# Patient Record
Sex: Male | Born: 1995 | Hispanic: Yes | Smoking: Current every day smoker
Health system: Southern US, Community
[De-identification: ages and names within clinical notes are randomized; demographics above are authoritative.]

---

## 2018-08-26 ENCOUNTER — Inpatient Hospital Stay
Admission: EM | Admit: 2018-08-26 | Discharge: 2018-09-01 | DRG: 193 | Disposition: A | Discharge status: Home / Self Care | Payer: Self-pay | Attending: Internal Medicine | Admitting: Internal Medicine

## 2018-08-26 ENCOUNTER — Emergency Department: Payer: Self-pay

## 2018-08-26 ENCOUNTER — Other Ambulatory Visit: Payer: Self-pay

## 2018-08-26 DIAGNOSIS — F17210 Nicotine dependence, cigarettes, uncomplicated: Secondary | ICD-10-CM | POA: Diagnosis present

## 2018-08-26 DIAGNOSIS — Z20828 Contact with and (suspected) exposure to other viral communicable diseases: Secondary | ICD-10-CM | POA: Diagnosis present

## 2018-08-26 DIAGNOSIS — E876 Hypokalemia: Secondary | ICD-10-CM | POA: Diagnosis present

## 2018-08-26 DIAGNOSIS — J189 Pneumonia, unspecified organism: Principal | ICD-10-CM

## 2018-08-26 DIAGNOSIS — J9601 Acute respiratory failure with hypoxia: Secondary | ICD-10-CM | POA: Diagnosis present

## 2018-08-26 DIAGNOSIS — R918 Other nonspecific abnormal finding of lung field: Secondary | ICD-10-CM

## 2018-08-26 DIAGNOSIS — J9690 Respiratory failure, unspecified, unspecified whether with hypoxia or hypercapnia: Secondary | ICD-10-CM

## 2018-08-26 DIAGNOSIS — J90 Pleural effusion, not elsewhere classified: Secondary | ICD-10-CM | POA: Diagnosis present

## 2018-08-26 DIAGNOSIS — R0902 Hypoxemia: Secondary | ICD-10-CM

## 2018-08-26 LAB — COMPREHENSIVE METABOLIC PANEL
ALT: 13 U/L (ref 0–44)
AST: 13 U/L — ABNORMAL LOW (ref 15–41)
Albumin: 3.7 g/dL (ref 3.5–5.0)
Alkaline Phosphatase: 51 U/L (ref 38–126)
Anion gap: 11 (ref 5–15)
BUN: 9 mg/dL (ref 6–20)
CO2: 23 mmol/L (ref 22–32)
Calcium: 8.4 mg/dL — ABNORMAL LOW (ref 8.9–10.3)
Chloride: 102 mmol/L (ref 98–111)
Creatinine, Ser: 0.95 mg/dL (ref 0.61–1.24)
GFR calc Af Amer: >60 mL/min (ref >60)
GFR calc non Af Amer: >60 mL/min (ref >60)
Glucose, Bld: 127 mg/dL — ABNORMAL HIGH (ref 70–99)
Potassium: 3.8 mmol/L (ref 3.5–5.1)
Sodium: 136 mmol/L (ref 135–145)
Total Bilirubin: 1.3 mg/dL — ABNORMAL HIGH (ref 0.3–1.2)
Total Protein: 7.1 g/dL (ref 6.5–8.1)

## 2018-08-26 LAB — CBC WITH DIFFERENTIAL/PLATELET
Abs Immature Granulocytes: 0.12 10*3/uL — ABNORMAL HIGH (ref 0.00–0.07)
Basophils Absolute: 0 10*3/uL (ref 0.0–0.1)
Basophils Relative: 0 %
Eosinophils Absolute: 0.2 10*3/uL (ref 0.0–0.5)
Eosinophils Relative: 1 %
HCT: 42.7 % (ref 39.0–52.0)
Hemoglobin: 14.9 g/dL (ref 13.0–17.0)
Immature Granulocytes: 1 %
Lymphocytes Relative: 5 %
Lymphs Abs: 0.8 10*3/uL (ref 0.7–4.0)
MCH: 30.9 pg (ref 26.0–34.0)
MCHC: 34.9 g/dL (ref 30.0–36.0)
MCV: 88.6 fL (ref 80.0–100.0)
Monocytes Absolute: 0.9 10*3/uL (ref 0.1–1.0)
Monocytes Relative: 5 %
Neutro Abs: 15.5 10*3/uL — ABNORMAL HIGH (ref 1.7–7.7)
Neutrophils Relative %: 88 %
Platelets: 215 10*3/uL (ref 150–400)
RBC: 4.82 MIL/uL (ref 4.22–5.81)
RDW: 12 % (ref 11.5–15.5)
WBC: 17.5 10*3/uL — ABNORMAL HIGH (ref 4.0–10.5)
nRBC: 0 % (ref 0.0–0.2)

## 2018-08-26 LAB — URINALYSIS, ROUTINE W REFLEX MICROSCOPIC
Bacteria, UA: NONE SEEN
Bilirubin Urine: NEGATIVE
Glucose, UA: NEGATIVE mg/dL
Ketones, ur: NEGATIVE mg/dL
Leukocytes,Ua: NEGATIVE
Nitrite: NEGATIVE
Protein, ur: NEGATIVE mg/dL
Specific Gravity, Urine: 1.004 — ABNORMAL LOW (ref 1.005–1.030)
Squamous Epithelial / HPF: NONE SEEN (ref 0–5)
WBC, UA: NONE SEEN WBC/hpf (ref 0–5)
pH: 8 (ref 5.0–8.0)

## 2018-08-26 LAB — INFLUENZA PANEL BY PCR (TYPE A & B)
Influenza A By PCR: NEGATIVE
Influenza B By PCR: NEGATIVE

## 2018-08-26 LAB — LACTIC ACID, PLASMA
Lactic Acid, Venous: 1.3 mmol/L (ref 0.5–1.9)
Lactic Acid, Venous: 0.8 mmol/L (ref 0.5–1.9)

## 2018-08-26 MED ORDER — SODIUM CHLORIDE 0.9 % IV BOLUS
1000.0000 mL | Freq: Once | INTRAVENOUS | Status: AC
  Administered 2018-08-26: 1000 mL via INTRAVENOUS

## 2018-08-26 MED ORDER — SODIUM CHLORIDE 0.9 % IV SOLN
2.0000 g | Freq: Once | INTRAVENOUS | Status: AC
  Administered 2018-08-26: 2 g via INTRAVENOUS
  Filled 2018-08-26: qty 2

## 2018-08-26 MED ORDER — VANCOMYCIN HCL IN DEXTROSE 1-5 GM/200ML-% IV SOLN
1000.0000 mg | Freq: Once | INTRAVENOUS | Status: AC
  Administered 2018-08-26: 1000 mg via INTRAVENOUS
  Filled 2018-08-26: qty 200

## 2018-08-26 MED ORDER — SODIUM CHLORIDE 0.9 % IV BOLUS
500.0000 mL | Freq: Once | INTRAVENOUS | Status: AC
  Administered 2018-08-26: 500 mL via INTRAVENOUS

## 2018-08-26 MED ORDER — ACETAMINOPHEN 500 MG PO TABS
1000.0000 mg | ORAL_TABLET | Freq: Once | ORAL | Status: AC
  Administered 2018-08-26: 1000 mg via ORAL
  Filled 2018-08-26: qty 2

## 2018-08-26 MED ORDER — SODIUM CHLORIDE 0.9 % IV SOLN
2.0000 g | Freq: Three times a day (TID) | INTRAVENOUS | Status: DC
  Administered 2018-08-26 – 2018-08-27 (×2): 2 g via INTRAVENOUS
  Filled 2018-08-26 (×4): qty 2

## 2018-08-26 MED ORDER — METRONIDAZOLE IN NACL 5-0.79 MG/ML-% IV SOLN
500.0000 mg | Freq: Once | INTRAVENOUS | Status: AC
  Administered 2018-08-26: 500 mg via INTRAVENOUS
  Filled 2018-08-26: qty 100

## 2018-08-26 MED ORDER — SODIUM CHLORIDE 0.9 % IV SOLN
INTRAVENOUS | Status: DC
  Administered 2018-08-26 – 2018-08-27 (×2): via INTRAVENOUS

## 2018-08-26 MED ORDER — ONDANSETRON HCL 4 MG PO TABS: 4.0000 mg | ORAL_TABLET | Freq: Four times a day (QID) | ORAL | Status: DC | PRN

## 2018-08-26 MED ORDER — HYDROXYCHLOROQUINE SULFATE 200 MG PO TABS
400.0000 mg | ORAL_TABLET | Freq: Two times a day (BID) | ORAL | Status: AC
  Administered 2018-08-26 – 2018-08-27 (×2): 400 mg via ORAL
  Filled 2018-08-26 (×2): qty 2

## 2018-08-26 MED ORDER — ONDANSETRON HCL 4 MG/2ML IJ SOLN: 4.0000 mg | Freq: Four times a day (QID) | INTRAMUSCULAR | Status: DC | PRN

## 2018-08-26 MED ORDER — ENOXAPARIN SODIUM 40 MG/0.4ML ~~LOC~~ SOLN: 40.0000 mg | SUBCUTANEOUS | Status: DC

## 2018-08-26 MED ORDER — HYDROXYCHLOROQUINE SULFATE 200 MG PO TABS
200.0000 mg | ORAL_TABLET | Freq: Two times a day (BID) | ORAL | Status: AC
  Administered 2018-08-27 – 2018-08-31 (×8): 200 mg via ORAL
  Filled 2018-08-26 (×8): qty 1

## 2018-08-26 MED ORDER — ACETAMINOPHEN 325 MG PO TABS
650.0000 mg | ORAL_TABLET | Freq: Four times a day (QID) | ORAL | Status: DC | PRN
  Administered 2018-08-27 – 2018-08-28 (×3): 650 mg via ORAL
  Filled 2018-08-26 (×3): qty 2

## 2018-08-26 MED ORDER — VANCOMYCIN HCL 10 G IV SOLR
1250.0000 mg | Freq: Two times a day (BID) | INTRAVENOUS | Status: DC
  Administered 2018-08-27: 1250 mg via INTRAVENOUS
  Filled 2018-08-26 (×3): qty 1250

## 2018-08-26 NOTE — H&P (Addendum)
[facility] - [place] at [facility]   PATIENT NAME: Anthony Pugh    MR#:  [id number]  DATE OF BIRTH:  10/29/1995  DATE OF ADMISSION:  08/26/2018  PRIMARY CARE PHYSICIAN: No primary care provider on file.   REQUESTING/REFERRING PHYSICIAN: Minna AntisPaduchowski Kevin MD  CHIEF COMPLAINT:   Chief Complaint  Patient presents with  . Fever  . Cough    HISTORY OF PRESENT ILLNESS: Anthony Pugh  is a 23 y.o. male with no significant medical history presents to the emergency room with shortness of breath cough and fever.  Patient since last night started coughing and becoming short of breath and fever that got progressively worse.  Patient went to the clinic and was found to be hypoxic with oxygen in the 80s.  Patient initially required 10 L nonrebreather but now is on less oxygen.  Continues to have dry cough in the emergency room.  PAST MEDICAL HISTORY: No medical history PAST SURGICAL HISTORY: none  SOCIAL HISTORY:  Social History   Tobacco Use  . Smoking status: Not on file  Substance Use Topics  . Alcohol use: Not on file    FAMILY HISTORY: History reviewed. No pertinent family history.  DRUG ALLERGIES: Not on File  REVIEW OF SYSTEMS:   CONSTITUTIONAL: No fever, fatigue or weakness.  EYES: No blurred or double vision.  EARS, NOSE, AND THROAT: No tinnitus or ear pain.  RESPIRATORY: Positive cough, positive shortness of breath, wheezing or hemoptysis.  CARDIOVASCULAR: No chest pain, orthopnea, edema.  GASTROINTESTINAL: No nausea, vomiting, diarrhea or abdominal pain.  GENITOURINARY: No dysuria, hematuria.  ENDOCRINE: No polyuria, nocturia,  HEMATOLOGY: No anemia, easy bruising or bleeding SKIN: No rash or lesion. MUSCULOSKELETAL: No joint pain or arthritis.   NEUROLOGIC: No tingling, numbness, weakness.  PSYCHIATRY: No anxiety or depression.   MEDICATIONS AT HOME:  Prior to Admission medications   Not on File      PHYSICAL  EXAMINATION:   VITAL SIGNS: Blood pressure (!) 163/83, pulse (!) 140, temperature (!) 100.4 F (38 C), resp. rate (!) 36, height 5\' 10"  (1.778 m), weight 68 kg, SpO2 95 %.  GENERAL:  23 y.o.-year-old patient lying in the bed mild respiratory distress EYES: Pupils equal, round, reactive to light and accommodation. No scleral icterus. Extraocular muscles intact.  HEENT: Head atraumatic, normocephalic. Oropharynx and nasopharynx clear.  NECK:  Supple, no jugular venous distention. No thyroid enlargement, no tenderness.  LUNGS: Bilateral rhonchus breath sounds  CARDIOVASCULAR: S1, S2 tachycardia l. No murmurs, rubs, or gallops.  ABDOMEN: Soft, nontender, nondistended. Bowel sounds present. No organomegaly or mass.  EXTREMITIES: No pedal edema, cyanosis, or clubbing.  NEUROLOGIC: Cranial nerves II through XII are intact. Muscle strength 5/5 in all extremities. Sensation intact. Gait not checked.  PSYCHIATRIC: The patient is alert and oriented x 3.  SKIN: No obvious rash, lesion, or ulcer.   LABORATORY PANEL:   CBC Recent Labs  Lab 08/26/18 1634  WBC 17.5*  HGB 14.9  HCT 42.7  PLT 215  MCV 88.6  MCH 30.9  MCHC 34.9  RDW 12.0  LYMPHSABS 0.8  MONOABS 0.9  EOSABS 0.2  BASOSABS 0.0   ------------------------------------------------------------------------------------------------------------------  Chemistries  Recent Labs  Lab 08/26/18 1634  NA 136  K 3.8  CL 102  CO2 23  GLUCOSE 127*  BUN 9  CREATININE 0.95  CALCIUM 8.4*  AST 13*  ALT 13  ALKPHOS 51  BILITOT 1.3*   ------------------------------------------------------------------------------------------------------------------ estimated creatinine clearance is 119.3 mL/min (  by C-G formula based on SCr of 0.95 mg/dL). ------------------------------------------------------------------------------------------------------------------ No results for input(s): TSH, T4TOTAL, T3FREE, THYROIDAB in the last 72  hours.  Invalid input(s): FREET3   Coagulation profile No results for input(s): INR, PROTIME in the last 168 hours. ------------------------------------------------------------------------------------------------------------------- No results for input(s): DDIMER in the last 72 hours. -------------------------------------------------------------------------------------------------------------------  Cardiac Enzymes No results for input(s): CKMB, TROPONINI, MYOGLOBIN in the last 168 hours.  Invalid input(s): CK ------------------------------------------------------------------------------------------------------------------ Invalid input(s): POCBNP  ---------------------------------------------------------------------------------------------------------------  Urinalysis    Component Value Date/Time   COLORURINE STRAW (A) 08/26/2018 1634   APPEARANCEUR CLEAR (A) 08/26/2018 1634   LABSPEC 1.004 (L) 08/26/2018 1634   PHURINE 8.0 08/26/2018 1634   GLUCOSEU NEGATIVE 08/26/2018 1634   HGBUR SMALL (A) 08/26/2018 1634   BILIRUBINUR NEGATIVE 08/26/2018 1634   KETONESUR NEGATIVE 08/26/2018 1634   PROTEINUR NEGATIVE 08/26/2018 1634   NITRITE NEGATIVE 08/26/2018 1634   LEUKOCYTESUR NEGATIVE 08/26/2018 1634     RADIOLOGY: Dg Chest Port 1 View  Result Date: 08/26/2018 CLINICAL DATA:  Shortness of breath with cough and fever EXAM: PORTABLE CHEST 1 VIEW COMPARISON:  None. FINDINGS: There is widespread airspace opacity throughout the mid and lower lung zones, somewhat more severe on the right than on the left. Heart size and pulmonary vascularity are normal. No adenopathy. No bone lesions. IMPRESSION: Extensive apparent multifocal pneumonia, more severe on the right than on the left. Atypical organism pneumonia is a differential consideration given this appearance. No adenopathy evident. Electronically Signed   By: Bretta Bang III M.D.   On: 08/26/2018 16:51    EKG: Orders placed or  performed during the hospital encounter of 08/26/18  . ED EKG 12-Lead  . ED EKG 12-Lead    IMPRESSION AND PLAN: Patient is a 23 year old with no past medical history presenting with acute respiratory failure  1.  Acute respiratory failure with multi lobar pneumonia suspect this is due to COVID-19 High risk We will treat patient with broad-spectrum antibiotics Check procalcitonin Infectious disease consult Aggressive IV fluid   2.  Miscellaneous Lovenox for DVT prophylaxis    All the records are reviewed and case discussed with ED provider. Management plans discussed with the patient, family and they are in agreement.  CODE STATUS: Full   TOTAL TIME TAKING CARE OF THIS PATIENT: 55 minutes.    Auburn Bilberry M.D on 08/26/2018 at 7:09 PM  Between 7am to 6pm - Pager - [phone]  After 6pm go to [email or web address] - password [email or web address]  [facility] Office  [phone]  CC: Primary care physician; No primary care provider on file.

## 2018-08-26 NOTE — ED Triage Notes (Signed)
Pt comes from home via EMS after pt has had fever and cough and SOB for one day. Pt went to urgent care and sats were in the 80s originally. Pt HR was 155 with EMS, oxygen sat was 96% on 10L nonrebreather. Interpreter being used for pt contact.

## 2018-08-26 NOTE — ED Provider Notes (Signed)
[facility] Emergency Department Provider Note  Time seen: 4:29 PM  I have reviewed the triage vital signs and the nursing notes.   HISTORY  Chief Complaint Fever and Cough    HPI Anthony Pugh is a 23 y.o. male with no significant past medical history who presents to the emergency department for shortness of breath, cough and fever.  According to the patient since last night he has been coughing with shortness of breath and fever that worsened today.  Patient went to the clinic today and was found to be hypoxic to 80% on room air.  No home O2 requirement at baseline, no chronic conditions such as asthma.  Patient denies any known sick contacts, denies any travel outside of the state.  Currently the patient appears well, alert oriented, no distress.  He is currently satting 98 to 100% on 10 L nonrebreather mask.  Patient is febrile to 101.  Continues to have a dry cough in the emergency department.   History reviewed. No pertinent past medical history.  There are no active problems to display for this patient.    Prior to Admission medications   Not on File    Not on File  History reviewed. No pertinent family history.  Social History Social History   Tobacco Use  . Smoking status: Not on file  Substance Use Topics  . Alcohol use: Not on file  . Drug use: Not on file    Review of Systems Constitutional: Positive for fever ENT: Negative for recent illness/congestion Cardiovascular: Mild chest tightness. Respiratory: Positive for shortness of breath. Gastrointestinal: Negative for abdominal pain.  One episode of vomiting this morning. Genitourinary: Negative for urinary compaints Musculoskeletal: Negative for musculoskeletal complaints Skin: Negative for skin complaints  Neurological: Negative for headache All other ROS negative  ____________________________________________   PHYSICAL EXAM:  VITAL SIGNS: ED Triage Vitals   Enc Vitals Group     BP 08/26/18 1624 139/84     Pulse Rate 08/26/18 1622 (!) 146     Resp 08/26/18 1626 (!) 32     Temp 08/26/18 1622 (!) 101 F (38.3 C)     Temp Source 08/26/18 1622 Oral     SpO2 08/26/18 1622 100 %     Weight 08/26/18 1624 150 lb (68 kg)     Height 08/26/18 1624 5\' 10"  (1.778 m)     Head Circumference --      Peak Flow --      Pain Score 08/26/18 1621 0     Pain Loc --      Pain Edu? --      Excl. in GC? --    Constitutional: Alert and oriented. Well appearing and in no distress. Eyes: Normal exam ENT   Head: Normocephalic and atraumatic.   Mouth/Throat: Mucous membranes are moist. Cardiovascular: Regular rhythm rate around 140 bpm.  No obvious murmur. Respiratory: Normal respiratory effort without tachypnea nor retractions. Breath sounds are clear.  Occasional dry cough. Gastrointestinal: Soft and nontender. No distention.   Musculoskeletal: Nontender with normal range of motion in all extremities. . Neurologic:  Normal speech and language. No gross focal neurologic deficits Skin:  Skin is warm, dry and intact.  Psychiatric: Mood and affect are normal.   ____________________________________________    EKG  EKG viewed and interpreted by myself shows a sinus tachycardia 149 bpm with a narrow QRS, normal axis, normal intervals, no concerning ST changes.  ____________________________________________    RADIOLOGY  Chest x-ray  consistent multifocal pneumonia.  ____________________________________________   INITIAL IMPRESSION / ASSESSMENT AND PLAN / ED COURSE  Pertinent labs & imaging results that were available during my care of the patient were reviewed by me and considered in my medical decision making (see chart for details).  Patient presents to the emergency department for fever cough hypoxia and tachycardia.  Patient meets sepsis criteria we will start broad-spectrum antibiotics, check labs including blood cultures.  Patient has  shortness of breath and chest discomfort.  Differential would include pneumonia, URI, viral illness such as influenza or corona.  We will check labs, cultures, check influenza as well as corona virus swabs.  We will start broad-spectrum antibiotics continue with significant IV hydration.  Patient agreeable to plan of care.  Chest x-ray consistent multifocal pneumonia.  Patient has a leukocytosis, febrile with tachycardia and tachypnea, hypoxia to 84% on room air in the emergency department.  Patient has been tested for coronavirus and flu which are both pending.  Patient has been started on broad-spectrum antibiotics will need admission to the hospital service for further treatment.    CRITICAL CARE Performed by: Minna AntisKevin Elzada Pytel   Total critical care time: 30 minutes  Critical care time was exclusive of separately billable procedures and treating other patients.  Critical care was necessary to treat or prevent imminent or life-threatening deterioration.  Critical care was time spent personally by me on the following activities: development of treatment plan with patient and/or surrogate as well as nursing, discussions with consultants, evaluation of patient's response to treatment, examination of patient, obtaining history from patient or surrogate, ordering and performing treatments and interventions, ordering and review of laboratory studies, ordering and review of radiographic studies, pulse oximetry and re-evaluation of patient's condition.   ____________________________________________   FINAL CLINICAL IMPRESSION(S) / ED DIAGNOSES  Sepsis Hypoxia Multifocal pneumonia   Minna AntisPaduchowski, Telissa Palmisano, MD 08/26/18 1729

## 2018-08-26 NOTE — Consult Note (Signed)
CODE SEPSIS - PHARMACY COMMUNICATION  **Broad Spectrum Antibiotics should be administered within 1 hour of Sepsis diagnosis**  Time Code Sepsis Called/Page Received: 1628  Antibiotics Ordered: Vancomycin/Cefepime/Metronidazole  Time of 1st antibiotic administration: 1655  Additional action taken by pharmacy: none  If necessary, Name of Provider/Nurse Contacted: none    Albina Billet, PharmD, BCPS Clinical Pharmacist 08/26/2018 4:51 PM

## 2018-08-26 NOTE — ED Notes (Signed)
ED TO INPATIENT HANDOFF REPORT  ED Nurse Name and Phone #: Lowanda Foster [phone]  S Name/Age/Gender Anthony Pugh 23 y.o. male Room/Bed: ED11A/ED11A  Code Status   Code Status: Not on file  Home/SNF/Other Home Patient oriented to: situation Is this baseline? Yes   Triage Complete: Triage complete  Chief Complaint Flu like Symptoms  Triage Note Pt comes from home via EMS after pt has had fever and cough and SOB for one day. Pt went to urgent care and sats were in the 80s originally. Pt HR was 155 with EMS, oxygen sat was 96% on 10L nonrebreather. Interpreter being used for pt contact.    Allergies Not on File  Level of Care/Admitting Diagnosis ED Disposition    ED Disposition Condition Comment   Admit  Hospital Area: [facility] [[id number]]  Level of Care: Telemetry [5]  Diagnosis: Multifocal pneumonia [4076808]  Admitting Physician: Auburn Bilberry [[id number]]  Attending Physician: Auburn Bilberry [[id number]]  Estimated length of stay: past midnight tomorrow  Certification:: I certify this patient will need inpatient services for at least 2 midnights  Bed request comments: COVID rule out high  PT Class (Do Not Modify): Inpatient [101]  PT Acc Code (Do Not Modify): Private [1]       B Medical/Surgery History History reviewed. No pertinent past medical history.    A IV Location/Drains/Wounds Patient Lines/Drains/Airways Status   Active Line/Drains/Airways    Name:   Placement date:   Placement time:   Site:   Days:   Peripheral IV 08/26/18 Left Antecubital   08/26/18    1646    Antecubital   less than 1   Peripheral IV 08/26/18 Right Antecubital   08/26/18    1647    Antecubital   less than 1          Intake/Output Last 24 hours No intake or output data in the 24 hours ending 08/26/18 1940  Labs/Imaging Results for orders placed or performed during the hospital encounter of 08/26/18 (from the past 48 hour(s))  Comprehensive metabolic  panel     Status: Abnormal   Collection Time: 08/26/18  4:34 PM  Result Value Ref Range   Sodium 136 135 - 145 mmol/L   Potassium 3.8 3.5 - 5.1 mmol/L   Chloride 102 98 - 111 mmol/L   CO2 23 22 - 32 mmol/L   Glucose, Bld 127 (H) 70 - 99 mg/dL   BUN 9 6 - 20 mg/dL   Creatinine, Ser 9.29 0.61 - 1.24 mg/dL   Calcium 8.4 (L) 8.9 - 10.3 mg/dL   Total Protein 7.1 6.5 - 8.1 g/dL   Albumin 3.7 3.5 - 5.0 g/dL   AST 13 (L) 15 - 41 U/L   ALT 13 0 - 44 U/L   Alkaline Phosphatase 51 38 - 126 U/L   Total Bilirubin 1.3 (H) 0.3 - 1.2 mg/dL   GFR calc non Af Amer >60 >60 mL/min   GFR calc Af Amer >60 >60 mL/min   Anion gap 11 5 - 15    Comment: Performed at [facility], [address] [place], [place] [address]  CBC WITH DIFFERENTIAL     Status: Abnormal   Collection Time: 08/26/18  4:34 PM  Result Value Ref Range   WBC 17.5 (H) 4.0 - 10.5 K/uL   RBC 4.82 4.22 - 5.81 MIL/uL   Hemoglobin 14.9 13.0 - 17.0 g/dL   HCT 86.3 81.7 - 71.1 %   MCV 88.6 80.0 -  100.0 fL   MCH 30.9 26.0 - 34.0 pg   MCHC 34.9 30.0 - 36.0 g/dL   RDW 40.912.0 81.111.5 - 91.415.5 %   Platelets 215 150 - 400 K/uL   nRBC 0.0 0.0 - 0.2 %   Neutrophils Relative % 88 %   Neutro Abs 15.5 (H) 1.7 - 7.7 K/uL   Lymphocytes Relative 5 %   Lymphs Abs 0.8 0.7 - 4.0 K/uL   Monocytes Relative 5 %   Monocytes Absolute 0.9 0.1 - 1.0 K/uL   Eosinophils Relative 1 %   Eosinophils Absolute 0.2 0.0 - 0.5 K/uL   Basophils Relative 0 %   Basophils Absolute 0.0 0.0 - 0.1 K/uL   Immature Granulocytes 1 %   Abs Immature Granulocytes 0.12 (H) 0.00 - 0.07 K/uL    Comment: Performed at [facility], [address] [place], [place] [address]  Urinalysis, Routine w reflex microscopic     Status: Abnormal   Collection Time: 08/26/18  4:34 PM  Result Value Ref Range   Color, Urine STRAW (A) YELLOW   APPearance CLEAR (A) CLEAR   Specific Gravity, Urine 1.004 (L) 1.005 - 1.030   pH 8.0 5.0 - 8.0   Glucose, UA NEGATIVE NEGATIVE mg/dL    Hgb urine dipstick SMALL (A) NEGATIVE   Bilirubin Urine NEGATIVE NEGATIVE   Ketones, ur NEGATIVE NEGATIVE mg/dL   Protein, ur NEGATIVE NEGATIVE mg/dL   Nitrite NEGATIVE NEGATIVE   Leukocytes,Ua NEGATIVE NEGATIVE   RBC / HPF 0-5 0 - 5 RBC/hpf   WBC, UA NONE SEEN 0 - 5 WBC/hpf   Bacteria, UA NONE SEEN NONE SEEN   Squamous Epithelial / LPF NONE SEEN 0 - 5    Comment: Performed at [facility], [address] [place], [place] [address]  Influenza panel by PCR (type A & B)     Status: None   Collection Time: 08/26/18  4:34 PM  Result Value Ref Range   Influenza A By PCR NEGATIVE NEGATIVE   Influenza B By PCR NEGATIVE NEGATIVE    Comment: (NOTE) The Xpert Xpress Flu assay is intended as an aid in the diagnosis of  influenza and should not be used as a sole basis for treatment.  This  assay is FDA approved for nasopharyngeal swab specimens only. Nasal  washings and aspirates are unacceptable for Xpert Xpress Flu testing. Performed at [facility], [address] [place], [place] [address]   Lactic acid, plasma     Status: None   Collection Time: 08/26/18  6:28 PM  Result Value Ref Range   Lactic Acid, Venous 1.3 0.5 - 1.9 mmol/L    Comment: Performed at [facility], [address] [place], [place] [address]   Dg Chest Port 1 View  Result Date: 08/26/2018 CLINICAL DATA:  Shortness of breath with cough and fever EXAM: PORTABLE CHEST 1 VIEW COMPARISON:  None. FINDINGS: There is widespread airspace opacity throughout the mid and lower lung zones, somewhat more severe on the right than on the left. Heart size and pulmonary vascularity are normal. No adenopathy. No bone lesions. IMPRESSION: Extensive apparent multifocal pneumonia, more severe on the right than on the left. Atypical organism pneumonia is a differential consideration given this appearance. No adenopathy evident. Electronically Signed   By: Bretta BangWilliam  Woodruff III M.D.   On: 08/26/2018 16:51     Pending Labs Unresulted Labs (From admission, onward)    Start     Ordered   08/26/18 1828  Lactic acid, plasma  Once,   STAT     08/26/18 1828   08/26/18 1629  Novel Coronavirus, NAA (hospital order; send-out to ref lab)  (Novel Coronavirus, NAA [facility] Order; send-out to ref lab) with precautions panel)  Once,   STAT    Question Answer Comment  Current symptoms Fever and Cough   Excluded other viral illnesses No (testing not indicated)   Patient immune status Normal      08/26/18 1629   08/26/18 1628  Blood Culture (routine x 2)  BLOOD CULTURE X 2,   STAT     08/26/18 1628   Signed and Held  Novel Coronavirus, NAA (hospital order; send-out to ref lab)  ([facility] IP COVID ADMIT NOVEL CORONAVIRUS, NAA (HOSPITAL ORDER; SEND-OUT TO REF LAB))  Once,   R    Question Answer Comment  Current symptoms Fever and Cough   Excluded other viral illnesses Yes   Exposure Risk Traveled to high risk areas in the last 14 days      Signed and Held   Signed and Held  CBC  (enoxaparin (LOVENOX)    CrCl >/= 30 ml/min)  Once,   R    Comments:  Baseline for enoxaparin therapy IF NOT ALREADY DRAWN.  Notify MD if PLT < 100 K.    Signed and Held   Signed and Held  Creatinine, serum  (enoxaparin (LOVENOX)    CrCl >/= 30 ml/min)  Once,   R    Comments:  Baseline for enoxaparin therapy IF NOT ALREADY DRAWN.    Signed and Held   Signed and Held  Creatinine, serum  (enoxaparin (LOVENOX)    CrCl >/= 30 ml/min)  Weekly,   R    Comments:  while on enoxaparin therapy    Signed and Held   Signed and Held  Respiratory Panel by PCR  Add-on,   R     Signed and Held   Signed and Held  Influenza panel by PCR (type A & B)  Add-on,   R     Signed and Held   Signed and Held  Culture, sputum-assessment  Once,   R     Signed and Held          Vitals/Pain Today's Vitals   08/26/18 1730 08/26/18 1800 08/26/18 1830 08/26/18 1900  BP: (!) 148/92 (!) 158/92 (!) 163/83   Pulse: (!) 145 (!) 147 (!) 140   Resp: (!) 40  (!) 47 (!) 36   Temp:    (!) 100.4 F (38 C)  TempSrc:      SpO2: 97% (!) 88% 95%   Weight:      Height:      PainSc:        Isolation Precautions Droplet and Contact precautions  Medications Medications  ceFEPIme (MAXIPIME) 2 g in sodium chloride 0.9 % 100 mL IVPB (2 g Intravenous Bolus 08/26/18 1655)  metroNIDAZOLE (FLAGYL) IVPB 500 mg (0 mg Intravenous Stopped 08/26/18 1805)  vancomycin (VANCOCIN) IVPB 1000 mg/200 mL premix (0 mg Intravenous Stopped 08/26/18 1852)  sodium chloride 0.9 % bolus 1,000 mL (1,000 mLs Intravenous Bolus 08/26/18 1744)  sodium chloride 0.9 % bolus 1,000 mL (1,000 mLs Intravenous Bolus 08/26/18 1652)  sodium chloride 0.9 % bolus 1,000 mL (1,000 mLs Intravenous Bolus 08/26/18 1650)  acetaminophen (TYLENOL) tablet 1,000 mg (1,000 mg Oral Given 08/26/18 1744)    Mobility walks Low fall risk   Focused Assessments Pulmonary Assessment Handoff:  Lung sounds: Bilateral Breath Sounds:  Diminished L Breath Sounds: Diminished R Breath Sounds: Diminished O2 Device: Nasal Cannula O2 Flow Rate (L/min): 3 L/min      R Recommendations: See Admitting Provider Note  Report given to:   Additional Notes:

## 2018-08-26 NOTE — Consult Note (Signed)
Pharmacy Antibiotic Note  Anthony Pugh is a 23 y.o. male admitted on 08/26/2018 with sepsis.  Pharmacy has been consulted for Vancomycin/Cefepime dosing.  Plan: Patient received Vancomycin 1000mg  IV x 1 ED, will follow with  Vancomycin 1250 mg IV Q 12 hrs. Goal AUC 400-550. Expected AUC: 493 SCr used: 0.95  Will dose Cefepime 2g IV q8hr   Height: 5\' 10"  (177.8 cm) Weight: 150 lb (68 kg) IBW/kg (Calculated) : 73  Temp (24hrs), Avg:100.7 F (38.2 C), Min:100.4 F (38 C), Max:101 F (38.3 C)  Recent Labs  Lab 08/26/18 1634 08/26/18 1828  WBC 17.5*  --   CREATININE 0.95  --   LATICACIDVEN  --  1.3    Estimated Creatinine Clearance: 119.3 mL/min (by C-G formula based on SCr of 0.95 mg/dL).    Not on File  Antimicrobials this admission: Vancomycin 4/8 >> Cefepime 4/8 >> Metronidazole 4/8 x 1  Dose adjustments this admission: None  Microbiology results: 4/8 BCx: pending 4/8 Novel Coronavirus pending  MRSA PCR?  Thank you for allowing pharmacy to be a part of this patient's care.  Albina Billet, PharmD, BCPS Clinical Pharmacist 08/26/2018 7:34 PM

## 2018-08-27 ENCOUNTER — Inpatient Hospital Stay: Payer: Self-pay

## 2018-08-27 ENCOUNTER — Other Ambulatory Visit: Payer: Self-pay

## 2018-08-27 DIAGNOSIS — R918 Other nonspecific abnormal finding of lung field: Secondary | ICD-10-CM

## 2018-08-27 DIAGNOSIS — R05 Cough: Secondary | ICD-10-CM

## 2018-08-27 DIAGNOSIS — R509 Fever, unspecified: Secondary | ICD-10-CM

## 2018-08-27 DIAGNOSIS — R0602 Shortness of breath: Secondary | ICD-10-CM

## 2018-08-27 DIAGNOSIS — R079 Chest pain, unspecified: Secondary | ICD-10-CM

## 2018-08-27 DIAGNOSIS — Z720 Tobacco use: Secondary | ICD-10-CM

## 2018-08-27 DIAGNOSIS — R000 Tachycardia, unspecified: Secondary | ICD-10-CM

## 2018-08-27 LAB — HEPATIC FUNCTION PANEL
ALT: 10 U/L (ref 0–44)
ALT: 10 U/L (ref 0–44)
AST: 13 U/L — ABNORMAL LOW (ref 15–41)
AST: 14 U/L — ABNORMAL LOW (ref 15–41)
Albumin: 3 g/dL — ABNORMAL LOW (ref 3.5–5.0)
Albumin: 3.1 g/dL — ABNORMAL LOW (ref 3.5–5.0)
Alkaline Phosphatase: 43 U/L (ref 38–126)
Alkaline Phosphatase: 40 U/L (ref 38–126)
Bilirubin, Direct: 0.2 mg/dL (ref 0.0–0.2)
Bilirubin, Direct: 0.1 mg/dL (ref 0.0–0.2)
Indirect Bilirubin: 0.9 mg/dL (ref 0.3–0.9)
Indirect Bilirubin: 0.6 mg/dL (ref 0.3–0.9)
Total Bilirubin: 1.1 mg/dL (ref 0.3–1.2)
Total Bilirubin: 0.7 mg/dL (ref 0.3–1.2)
Total Protein: 6.2 g/dL — ABNORMAL LOW (ref 6.5–8.1)
Total Protein: 6.3 g/dL — ABNORMAL LOW (ref 6.5–8.1)

## 2018-08-27 LAB — RESPIRATORY PANEL BY PCR

## 2018-08-27 LAB — BASIC METABOLIC PANEL
Anion gap: 10 (ref 5–15)
Anion gap: 9 (ref 5–15)
BUN: 7 mg/dL (ref 6–20)
BUN: 9 mg/dL (ref 6–20)
CO2: 20 mmol/L — ABNORMAL LOW (ref 22–32)
CO2: 23 mmol/L (ref 22–32)
Calcium: 7.9 mg/dL — ABNORMAL LOW (ref 8.9–10.3)
Calcium: 8.1 mg/dL — ABNORMAL LOW (ref 8.9–10.3)
Chloride: 106 mmol/L (ref 98–111)
Chloride: 105 mmol/L (ref 98–111)
Creatinine, Ser: 0.64 mg/dL (ref 0.61–1.24)
Creatinine, Ser: 0.79 mg/dL (ref 0.61–1.24)
GFR calc Af Amer: >60 mL/min (ref >60)
GFR calc Af Amer: >60 mL/min (ref >60)
GFR calc non Af Amer: >60 mL/min (ref >60)
GFR calc non Af Amer: >60 mL/min (ref >60)
Glucose, Bld: 118 mg/dL — ABNORMAL HIGH (ref 70–99)
Glucose, Bld: 118 mg/dL — ABNORMAL HIGH (ref 70–99)
Potassium: 3.7 mmol/L (ref 3.5–5.1)
Potassium: 3.7 mmol/L (ref 3.5–5.1)
Sodium: 136 mmol/L (ref 135–145)
Sodium: 137 mmol/L (ref 135–145)

## 2018-08-27 LAB — FERRITIN: Ferritin: 375 ng/mL — ABNORMAL HIGH (ref 24–336)

## 2018-08-27 LAB — C DIFFICILE QUICK SCREEN W PCR REFLEX
C Diff antigen: NEGATIVE
C Diff interpretation: NOT DETECTED

## 2018-08-27 LAB — C DIFFICILE QUICK SCREEN W PCR REFLEX??: C Diff toxin: NEGATIVE

## 2018-08-27 LAB — CBC
HCT: 40.1 % (ref 39.0–52.0)
Hemoglobin: 13.7 g/dL (ref 13.0–17.0)
MCH: 31.4 pg (ref 26.0–34.0)
MCHC: 34.2 g/dL (ref 30.0–36.0)
MCV: 91.8 fL (ref 80.0–100.0)
Platelets: 197 10*3/uL (ref 150–400)
RBC: 4.37 MIL/uL (ref 4.22–5.81)
RDW: 12.4 % (ref 11.5–15.5)
WBC: 14.2 10*3/uL — ABNORMAL HIGH (ref 4.0–10.5)
nRBC: 0 % (ref 0.0–0.2)

## 2018-08-27 LAB — PROCALCITONIN: Procalcitonin: 0.25 ng/mL

## 2018-08-27 LAB — C-REACTIVE PROTEIN: CRP: 23.9 mg/dL — ABNORMAL HIGH (ref <1.0)

## 2018-08-27 LAB — INFLUENZA PANEL BY PCR (TYPE A & B)
Influenza A By PCR: NEGATIVE
Influenza B By PCR: NEGATIVE

## 2018-08-27 LAB — GLUCOSE, CAPILLARY: Glucose-Capillary: 103 mg/dL — ABNORMAL HIGH (ref 70–99)

## 2018-08-27 LAB — LACTATE DEHYDROGENASE: LDH: 116 U/L (ref 98–192)

## 2018-08-27 LAB — MAGNESIUM: Magnesium: 2.1 mg/dL (ref 1.7–2.4)

## 2018-08-27 LAB — MRSA PCR SCREENING: MRSA by PCR: NEGATIVE

## 2018-08-27 MED ORDER — SODIUM CHLORIDE 0.9 % IV SOLN
2.0000 g | INTRAVENOUS | Status: DC
  Administered 2018-08-28: 2 g via INTRAVENOUS
  Filled 2018-08-27: qty 20
  Filled 2018-08-27: qty 2

## 2018-08-27 MED ORDER — DILTIAZEM LOAD VIA INFUSION: 10.0000 mg | Freq: Once | INTRAVENOUS | Status: DC

## 2018-08-27 MED ORDER — DILTIAZEM HCL 25 MG/5ML IV SOLN
10.0000 mg | Freq: Once | INTRAVENOUS | Status: AC
  Administered 2018-08-27: 10 mg via INTRAVENOUS
  Filled 2018-08-27: qty 5

## 2018-08-27 MED ORDER — COLCHICINE 0.6 MG PO TABS
0.6000 mg | ORAL_TABLET | Freq: Two times a day (BID) | ORAL | Status: DC
  Administered 2018-08-27 – 2018-08-29 (×5): 0.6 mg via ORAL
  Filled 2018-08-27 (×5): qty 1

## 2018-08-27 MED ORDER — DILTIAZEM HCL 25 MG/5ML IV SOLN
5.0000 mg | Freq: Once | INTRAVENOUS | Status: AC
  Administered 2018-08-27: 5 mg via INTRAVENOUS
  Filled 2018-08-27: qty 5

## 2018-08-27 MED ORDER — SODIUM CHLORIDE 0.9 % IV SOLN
500.0000 mg | INTRAVENOUS | Status: DC
  Administered 2018-08-28 – 2018-08-31 (×4): 500 mg via INTRAVENOUS
  Filled 2018-08-27 (×6): qty 500

## 2018-08-27 MED ORDER — DILTIAZEM HCL-DEXTROSE 100-5 MG/100ML-% IV SOLN (PREMIX): 5.0000 mg/h | INTRAVENOUS | Status: DC

## 2018-08-27 MED ORDER — SODIUM CHLORIDE 0.9 % IV SOLN
500.0000 mg | INTRAVENOUS | Status: DC
  Administered 2018-08-27: 500 mg via INTRAVENOUS
  Filled 2018-08-27: qty 500

## 2018-08-27 MED ORDER — SODIUM CHLORIDE 0.9 % IV BOLUS
500.0000 mL | Freq: Once | INTRAVENOUS | Status: AC
  Administered 2018-08-27: 500 mL via INTRAVENOUS

## 2018-08-27 MED ORDER — ZINC SULFATE 220 (50 ZN) MG PO CAPS
220.0000 mg | ORAL_CAPSULE | Freq: Every day | ORAL | Status: AC
  Administered 2018-08-27 – 2018-08-31 (×5): 220 mg via ORAL
  Filled 2018-08-27 (×5): qty 1

## 2018-08-27 MED ORDER — VITAMIN C 500 MG PO TABS
500.0000 mg | ORAL_TABLET | Freq: Every day | ORAL | Status: AC
  Administered 2018-08-27 – 2018-08-31 (×5): 500 mg via ORAL
  Filled 2018-08-27 (×5): qty 1

## 2018-08-27 MED ORDER — SODIUM CHLORIDE 0.9 % IV SOLN
2.0000 g | INTRAVENOUS | Status: DC
  Administered 2018-08-27: 2 g via INTRAVENOUS
  Filled 2018-08-27: qty 20
  Filled 2018-08-27: qty 2

## 2018-08-27 MED ORDER — ENOXAPARIN SODIUM 40 MG/0.4ML ~~LOC~~ SOLN
40.0000 mg | SUBCUTANEOUS | Status: DC
  Administered 2018-08-27 – 2018-08-30 (×4): 40 mg via SUBCUTANEOUS
  Filled 2018-08-27 (×5): qty 0.4

## 2018-08-27 MED ORDER — POTASSIUM CHLORIDE CRYS ER 20 MEQ PO TBCR
40.0000 meq | EXTENDED_RELEASE_TABLET | Freq: Once | ORAL | Status: AC
  Administered 2018-08-27: 40 meq via ORAL
  Filled 2018-08-27: qty 2

## 2018-08-27 MED ORDER — ACETAMINOPHEN 325 MG PO TABS
325.0000 mg | ORAL_TABLET | Freq: Once | ORAL | Status: AC
  Administered 2018-08-27: 325 mg via ORAL
  Filled 2018-08-27: qty 1

## 2018-08-27 NOTE — Progress Notes (Signed)
MD Mansy made aware of pt's HR, Fever, and I expressed concerns of MEWS of 6- 7 on shift. Verbal orders received for a Magnesium level, if Mag level is < 2, pt will need 2g of Magnesium IV. I will also give 325 mg more of Tylenol PO, 10 mg IV push of Cardizem, cooling blanket, and 40 Meq of Potasium.

## 2018-08-27 NOTE — Progress Notes (Signed)
Report given to Trula Ore, RN in ICU.  Pt transported via WC with oxygen and IV to ICU.

## 2018-08-27 NOTE — Progress Notes (Signed)
MD  Mansy made aware that pt's HR is up to the 170's when pt is up to the bathroom and it's still sustaining in the 120-140's, a verbal order given for 5 mg IV cardizem and strict I&O's. Will continue to monitor.

## 2018-08-27 NOTE — Progress Notes (Signed)
Pt's MEWS score increased to RED zone due to sinus tachycardia (125) and tachypnea (RR 40).  Pt denies feeling SOB or having difficulty breathing.  Resp effort is shallow and using abdominal muscles.  Diminished breath sounds throughout, and rhonchi to LLL.  Dr. Nemiah Commander contacted and received new orders to transfer to ICU.  Charge RN updated of situation.

## 2018-08-27 NOTE — Progress Notes (Signed)
[facility] - [place] [facility]   PATIENT NAME: Anthony FinlandJose Arrmando Ocampo Pugh    MR#:  [id number]  DATE OF BIRTH:  09/01/1995  SUBJECTIVE:  CHIEF COMPLAINT:   Chief Complaint  Patient presents with  . Fever  . Cough   - afebrile, feels some better - still on 4L o2 - loose stools today  REVIEW OF SYSTEMS:  Review of Systems  Constitutional: Negative for chills, fever and malaise/fatigue.  HENT: Negative for congestion, ear discharge, hearing loss and nosebleeds.   Eyes: Negative for blurred vision and double vision.  Respiratory: Positive for shortness of breath. Negative for cough and wheezing.   Cardiovascular: Positive for chest pain and palpitations. Negative for leg swelling.  Gastrointestinal: Positive for diarrhea. Negative for abdominal pain, constipation, nausea and vomiting.  Genitourinary: Negative for dysuria.  Musculoskeletal: Negative for myalgias.  Neurological: Negative for dizziness, focal weakness, seizures and headaches.  Psychiatric/Behavioral: Negative for depression.    DRUG ALLERGIES:  Not on File  VITALS:  Blood pressure 122/88, pulse (!) 117, temperature 98.8 F (37.1 C), temperature source Oral, resp. rate (!) 25, height 5\' 10"  (1.778 m), weight 68 kg, SpO2 100 %.  PHYSICAL EXAMINATION:  Physical Exam   GENERAL:  23 y.o.-year-old patient lying in the bed with no acute distress.  EYES: Pupils equal, round, reactive to light and accommodation. No scleral icterus. Extraocular muscles intact.  HEENT: Head atraumatic, normocephalic. Oropharynx and nasopharynx clear.  NECK:  Supple, no jugular venous distention. No thyroid enlargement, no tenderness.  LUNGS: moving air bilaterally, left basilar crackles and rhonchi, no wheezing,   or crepitation. No use of accessory muscles of respiration.  Diminished right basilar breath sounds CARDIOVASCULAR: S1, S2 rapid and regular. No murmurs, rubs, or gallops.  ABDOMEN: Soft, nontender,  nondistended. Bowel sounds present. No organomegaly or mass.  EXTREMITIES: No pedal edema, cyanosis, or clubbing.  NEUROLOGIC: Cranial nerves II through XII are intact. Muscle strength 5/5 in all extremities. Sensation intact. Gait not checked.  PSYCHIATRIC: The patient is alert and oriented x 3.  SKIN: No obvious rash, lesion, or ulcer.    LABORATORY PANEL:   CBC Recent Labs  Lab 08/26/18 1634  WBC 17.5*  HGB 14.9  HCT 42.7  PLT 215   ------------------------------------------------------------------------------------------------------------------  Chemistries  Recent Labs  Lab 08/26/18 1634 08/27/18 0629 08/27/18 0640  NA 136  --  136  K 3.8  --  3.7  CL 102  --  106  CO2 23  --  20*  GLUCOSE 127*  --  118*  BUN 9  --  7  CREATININE 0.95  --  0.64  CALCIUM 8.4*  --  7.9*  MG  --  2.1  --   AST 13*  --   --   ALT 13  --   --   ALKPHOS 51  --   --   BILITOT 1.3*  --   --    ------------------------------------------------------------------------------------------------------------------  Cardiac Enzymes No results for input(s): TROPONINI in the last 168 hours. ------------------------------------------------------------------------------------------------------------------  RADIOLOGY:  Dg Chest Port 1 View  Result Date: 08/26/2018 CLINICAL DATA:  Shortness of breath with cough and fever EXAM: PORTABLE CHEST 1 VIEW COMPARISON:  None. FINDINGS: There is widespread airspace opacity throughout the mid and lower lung zones, somewhat more severe on the right than on the left. Heart size and pulmonary vascularity are normal. No adenopathy. No bone lesions. IMPRESSION: Extensive apparent multifocal pneumonia, more severe on the right than on  the left. Atypical organism pneumonia is a differential consideration given this appearance. No adenopathy evident. Electronically Signed   By: Bretta Bang III M.D.   On: 08/26/2018 16:51    EKG:   Orders placed or performed  during the hospital encounter of 08/26/18  . ED EKG 12-Lead  . ED EKG 12-Lead    ASSESSMENT AND PLAN:   23 year old no significant past medical history presents to hospital secondary to chest pain, shortness of breath and fever.  1.  Acute hypoxic respiratory failure-multilobar pneumonia versus COVID-19 infection -On high risk precautions -COVID-19 test is pending. -Appreciate ID consult. -Patient currently on 4 L supplemental oxygen -On broad-spectrum antibiotics with vancomycin and cefepime.  Procalcitonin is slightly elevated only -On hydroxychloroquine -Check HIV testing.  Patient denies any IVDA or risky sexual behaviors.  2.  Leukocytosis-secondary to pneumonia  3.  DVT prophylaxis-Lovenox  4.  Hypokalemia-replaced   All the records are reviewed and case discussed with Care Management/Social Workerr. Management plans discussed with the patient, family and they are in agreement.  CODE STATUS: Full Code  TOTAL TIME TAKING CARE OF THIS PATIENT: 38 minutes.   POSSIBLE D/C IN 2-3 DAYS, DEPENDING ON CLINICAL CONDITION.   Enid Baas M.D on 08/27/2018 at 12:47 PM  Between 7am to 6pm - Pager - [phone]  After 6pm go to [email or web address] - password [email or web address]  [facility]  Office  [phone]  CC: Primary care physician; Patient, No Pcp Per

## 2018-08-27 NOTE — Consult Note (Addendum)
Reason for Consult: Assistance with management of acute hypoxic respiratory failure in the setting of potential COVID 19. Referring Physician: Enid Baas   Patient was interviewed in his native [place].  Anthony Pugh is an 23 y.o. male.  HPI:  The patient is a 24 year old [place] gentleman, who has been in this country for approximately a year.  He presented yesterday with complaints of generalized malaise and fever, he also describes a sensation of heaviness and tightness in his chest making it difficult to breathe.  He does not have any pleurodynia.  The symptoms started approximately 3 days prior to admission.  He has been working in [employer] and was able to go last to work on April 3.  He was not able to work Monday or Tuesday due to fatigue and then onset of symptoms.  Though he has never smoked, he states that he started this habit approximately 20 days ago and is smoking approximately 5 cigarettes/day.  He does not use a vape.  No known sick contacts in the home  He had not had any gastrointestinal symptoms until today when he had some loose stools but he attributes this to the IV antibiotics.   History reviewed. No pertinent past medical history.  History reviewed. No pertinent surgical history.  History reviewed. No pertinent family history.  Social History:  reports that he has never smoked. He has never used smokeless tobacco. He reports that he does not drink alcohol. No history on file for drug.  Recently picked up a smoking habit.  He lives with his father, brother, cousin and aunt.  Allergies: Not on File  Medications: I have reviewed the patient's current medications.  Results for orders placed or performed during the hospital encounter of 08/26/18 (from the past 48 hour(s))  Comprehensive metabolic panel     Status: Abnormal   Collection Time: 08/26/18  4:34 PM  Result Value Ref Range   Sodium 136 135 - 145 mmol/L   Potassium 3.8 3.5 - 5.1  mmol/L   Chloride 102 98 - 111 mmol/L   CO2 23 22 - 32 mmol/L   Glucose, Bld 127 (H) 70 - 99 mg/dL   BUN 9 6 - 20 mg/dL   Creatinine, Ser 4.09 0.61 - 1.24 mg/dL   Calcium 8.4 (L) 8.9 - 10.3 mg/dL   Total Protein 7.1 6.5 - 8.1 g/dL   Albumin 3.7 3.5 - 5.0 g/dL   AST 13 (L) 15 - 41 U/L   ALT 13 0 - 44 U/L   Alkaline Phosphatase 51 38 - 126 U/L   Total Bilirubin 1.3 (H) 0.3 - 1.2 mg/dL   GFR calc non Af Amer >60 >60 mL/min   GFR calc Af Amer >60 >60 mL/min   Anion gap 11 5 - 15    Comment: Performed at [facility], [address] [place], [place] [address]  CBC WITH DIFFERENTIAL     Status: Abnormal   Collection Time: 08/26/18  4:34 PM  Result Value Ref Range   WBC 17.5 (H) 4.0 - 10.5 K/uL   RBC 4.82 4.22 - 5.81 MIL/uL   Hemoglobin 14.9 13.0 - 17.0 g/dL   HCT 47.8 29.5 - 62.1 %   MCV 88.6 80.0 - 100.0 fL   MCH 30.9 26.0 - 34.0 pg   MCHC 34.9 30.0 - 36.0 g/dL   RDW 30.8 65.7 - 84.6 %   Platelets 215 150 - 400 K/uL   nRBC 0.0 0.0 - 0.2 %  Neutrophils Relative % 88 %   Neutro Abs 15.5 (H) 1.7 - 7.7 K/uL   Lymphocytes Relative 5 %   Lymphs Abs 0.8 0.7 - 4.0 K/uL   Monocytes Relative 5 %   Monocytes Absolute 0.9 0.1 - 1.0 K/uL   Eosinophils Relative 1 %   Eosinophils Absolute 0.2 0.0 - 0.5 K/uL   Basophils Relative 0 %   Basophils Absolute 0.0 0.0 - 0.1 K/uL   Immature Granulocytes 1 %   Abs Immature Granulocytes 0.12 (H) 0.00 - 0.07 K/uL    Comment: Performed at [facility], [address] [place], [place] [address]  Blood Culture (routine x 2)     Status: None (Preliminary result)   Collection Time: 08/26/18  4:34 PM  Result Value Ref Range   Specimen Description BLOOD RIGHT ANTECUBITAL    Special Requests      BOTTLES DRAWN AEROBIC AND ANAEROBIC Blood Culture adequate volume   Culture      NO GROWTH < 24 HOURS Performed at [facility], [address] [place], [place] [address]    Report Status PENDING   Blood Culture (routine x 2)      Status: None (Preliminary result)   Collection Time: 08/26/18  4:34 PM  Result Value Ref Range   Specimen Description BLOOD BLOOD RIGHT ARM    Special Requests      BOTTLES DRAWN AEROBIC AND ANAEROBIC Blood Culture results may not be optimal due to an excessive volume of blood received in culture bottles   Culture      NO GROWTH < 24 HOURS Performed at [facility], [address], [place], [place] [address]    Report Status PENDING   Urinalysis, Routine w reflex microscopic     Status: Abnormal   Collection Time: 08/26/18  4:34 PM  Result Value Ref Range   Color, Urine STRAW (A) YELLOW   APPearance CLEAR (A) CLEAR   Specific Gravity, Urine 1.004 (L) 1.005 - 1.030   pH 8.0 5.0 - 8.0   Glucose, UA NEGATIVE NEGATIVE mg/dL   Hgb urine dipstick SMALL (A) NEGATIVE   Bilirubin Urine NEGATIVE NEGATIVE   Ketones, ur NEGATIVE NEGATIVE mg/dL   Protein, ur NEGATIVE NEGATIVE mg/dL   Nitrite NEGATIVE NEGATIVE   Leukocytes,Ua NEGATIVE NEGATIVE   RBC / HPF 0-5 0 - 5 RBC/hpf   WBC, UA NONE SEEN 0 - 5 WBC/hpf   Bacteria, UA NONE SEEN NONE SEEN   Squamous Epithelial / LPF NONE SEEN 0 - 5    Comment: Performed at [facility], [address], [place], [place] [address]  Influenza panel by PCR (type A & B)     Status: None   Collection Time: 08/26/18  4:34 PM  Result Value Ref Range   Influenza A By PCR NEGATIVE NEGATIVE   Influenza B By PCR NEGATIVE NEGATIVE    Comment: (NOTE) The Xpert Xpress Flu assay is intended as an aid in the diagnosis of  influenza and should not be used as a sole basis for treatment.  This  assay is FDA approved for nasopharyngeal swab specimens only. Nasal  washings and aspirates are unacceptable for Xpert Xpress Flu testing. Performed at [facility], [address] [place], [place] [address]   Respiratory Panel by PCR     Status: None   Collection Time: 08/26/18  4:34 PM  Result Value Ref Range   Adenovirus NOT DETECTED NOT  DETECTED   Coronavirus 229E NOT DETECTED  NOT DETECTED    Comment: (NOTE) The Coronavirus on the Respiratory Panel, DOES NOT test for the novel  Coronavirus (2019 nCoV)    Coronavirus HKU1 NOT DETECTED NOT DETECTED   Coronavirus NL63 NOT DETECTED NOT DETECTED   Coronavirus OC43 NOT DETECTED NOT DETECTED   Metapneumovirus NOT DETECTED NOT DETECTED   Rhinovirus / Enterovirus NOT DETECTED NOT DETECTED   Influenza A NOT DETECTED NOT DETECTED   Influenza B NOT DETECTED NOT DETECTED   Parainfluenza Virus 1 NOT DETECTED NOT DETECTED   Parainfluenza Virus 2 NOT DETECTED NOT DETECTED   Parainfluenza Virus 3 NOT DETECTED NOT DETECTED   Parainfluenza Virus 4 NOT DETECTED NOT DETECTED   Respiratory Syncytial Virus NOT DETECTED NOT DETECTED   Bordetella pertussis NOT DETECTED NOT DETECTED   Chlamydophila pneumoniae NOT DETECTED NOT DETECTED   Mycoplasma pneumoniae NOT DETECTED NOT DETECTED    Comment: Performed at [facility], [address], [place], [place] [address]  Influenza panel by PCR (type A & B)     Status: None   Collection Time: 08/26/18  4:34 PM  Result Value Ref Range   Influenza A By PCR NEGATIVE NEGATIVE   Influenza B By PCR NEGATIVE NEGATIVE    Comment: (NOTE) The Xpert Xpress Flu assay is intended as an aid in the diagnosis of  influenza and should not be used as a sole basis for treatment.  This  assay is FDA approved for nasopharyngeal swab specimens only. Nasal  washings and aspirates are unacceptable for Xpert Xpress Flu testing. Performed at [facility], [address] [place], [place] [address]   Lactic acid, plasma     Status: None   Collection Time: 08/26/18  6:28 PM  Result Value Ref Range   Lactic Acid, Venous 1.3 0.5 - 1.9 mmol/L    Comment: Performed at [facility], [address] [place], [place] [address]  Lactic acid, plasma     Status: None   Collection Time: 08/26/18  8:38 PM  Result Value Ref Range   Lactic Acid, Venous 0.8  0.5 - 1.9 mmol/L    Comment: Performed at [facility], [address], [place], [place] [address]  Magnesium     Status: None   Collection Time: 08/27/18  6:29 AM  Result Value Ref Range   Magnesium 2.1 1.7 - 2.4 mg/dL    Comment: Performed at [facility], [address] [place], [place] [address]  Procalcitonin - Baseline     Status: None   Collection Time: 08/27/18  6:29 AM  Result Value Ref Range   Procalcitonin 0.25 ng/mL    Comment:        Interpretation: PCT (Procalcitonin) <= 0.5 ng/mL: Systemic infection (sepsis) is not likely. Local bacterial infection is possible. (NOTE)       Sepsis PCT Algorithm           Lower Respiratory Tract                                      Infection PCT Algorithm    ----------------------------     ----------------------------         PCT < 0.25 ng/mL                PCT < 0.10 ng/mL         Strongly encourage  Strongly discourage   discontinuation of antibiotics    initiation of antibiotics    ----------------------------     -----------------------------       PCT 0.25 - 0.50 ng/mL            PCT 0.10 - 0.25 ng/mL               OR       >80% decrease in PCT            Discourage initiation of                                            antibiotics      Encourage discontinuation           of antibiotics    ----------------------------     -----------------------------         PCT >= 0.50 ng/mL              PCT 0.26 - 0.50 ng/mL               AND        <80% decrease in PCT             Encourage initiation of                                             antibiotics       Encourage continuation           of antibiotics    ----------------------------     -----------------------------        PCT >= 0.50 ng/mL                  PCT > 0.50 ng/mL               AND         increase in PCT                  Strongly encourage                                      initiation of antibiotics    Strongly encourage  escalation           of antibiotics                                     -----------------------------                                           PCT <= 0.25 ng/mL                                                 OR                                        >  80% decrease in PCT                                     Discontinue / Do not initiate                                             antibiotics Performed at [facility], [address] [place], [place] [address]   Hepatic function panel     Status: Abnormal   Collection Time: 08/27/18  6:29 AM  Result Value Ref Range   Total Protein 6.2 (L) 6.5 - 8.1 g/dL   Albumin 3.0 (L) 3.5 - 5.0 g/dL   AST 13 (L) 15 - 41 U/L   ALT 10 0 - 44 U/L   Alkaline Phosphatase 43 38 - 126 U/L   Total Bilirubin 1.1 0.3 - 1.2 mg/dL   Bilirubin, Direct 0.2 0.0 - 0.2 mg/dL   Indirect Bilirubin 0.9 0.3 - 0.9 mg/dL    Comment: Performed at [facility], [address] [place], [place] [address]  Ferritin     Status: Abnormal   Collection Time: 08/27/18  6:29 AM  Result Value Ref Range   Ferritin 375 (H) 24 - 336 ng/mL    Comment: Performed at [facility], [address] [place], [place] [address]  C-reactive protein     Status: Abnormal   Collection Time: 08/27/18  6:29 AM  Result Value Ref Range   CRP 23.9 (H) <1.0 mg/dL    Comment: Performed at [facility], [address], [place], [place] [address]  Basic metabolic panel     Status: Abnormal   Collection Time: 08/27/18  6:40 AM  Result Value Ref Range   Sodium 136 135 - 145 mmol/L   Potassium 3.7 3.5 - 5.1 mmol/L   Chloride 106 98 - 111 mmol/L   CO2 20 (L) 22 - 32 mmol/L   Glucose, Bld 118 (H) 70 - 99 mg/dL   BUN 7 6 - 20 mg/dL   Creatinine, Ser 2.95 0.61 - 1.24 mg/dL   Calcium 7.9 (L) 8.9 - 10.3 mg/dL   GFR calc non Af Amer >60 >60 mL/min   GFR calc Af Amer >60 >60 mL/min   Anion gap 10 5 - 15    Comment: Performed at [facility], [address] [place], [place] [address]  C difficile quick scan w PCR reflex     Status: None   Collection Time: 08/27/18 12:08 PM  Result Value Ref Range   C Diff antigen NEGATIVE NEGATIVE   C Diff toxin NEGATIVE NEGATIVE   C Diff interpretation No C. difficile detected.     Comment: Performed at [facility], [address] [place], [place] [address]  CBC     Status: Abnormal   Collection Time: 08/27/18 12:12 PM  Result Value Ref Range   WBC 14.2 (H) 4.0 - 10.5 K/uL   RBC 4.37 4.22 - 5.81 MIL/uL   Hemoglobin 13.7 13.0 - 17.0 g/dL   HCT 46.9 62.9 - 52.8 %   MCV 91.8 80.0 - 100.0 fL   MCH 31.4 26.0 - 34.0 pg   MCHC 34.2 30.0 - 36.0 g/dL   RDW 41.3 24.4 - 01.0 %   Platelets 197 150 - 400  K/uL   nRBC 0.0 0.0 - 0.2 %    Comment: Performed at [facility], [address] [place], [place] [address]  MRSA PCR Screening     Status: None   Collection Time: 08/27/18  2:10 PM  Result Value Ref Range   MRSA by PCR NEGATIVE NEGATIVE    Comment:        The GeneXpert MRSA Assay (FDA approved for NASAL specimens only), is one component of a comprehensive MRSA colonization surveillance program. It is not intended to diagnose MRSA infection nor to guide or monitor treatment for MRSA infections. Performed at [facility], [address] [place], [place] [address]   Lactate dehydrogenase     Status: None   Collection Time: 08/27/18  3:38 PM  Result Value Ref Range   LDH 116 98 - 192 U/L    Comment: Performed at [facility], [address] [place], [place] [address]  Glucose, capillary     Status: Abnormal   Collection Time: 08/27/18  4:55 PM  Result Value Ref Range   Glucose-Capillary 103 (H) 70 - 99 mg/dL  Basic metabolic panel     Status: Abnormal   Collection Time: 08/27/18  4:56 PM  Result Value Ref Range   Sodium 137 135 - 145 mmol/L   Potassium 3.7 3.5 - 5.1 mmol/L   Chloride 105 98 - 111 mmol/L   CO2 23 22 - 32 mmol/L   Glucose, Bld 118  (H) 70 - 99 mg/dL   BUN 9 6 - 20 mg/dL   Creatinine, Ser 9.14 0.61 - 1.24 mg/dL   Calcium 8.1 (L) 8.9 - 10.3 mg/dL   GFR calc non Af Amer >60 >60 mL/min   GFR calc Af Amer >60 >60 mL/min   Anion gap 9 5 - 15    Comment: Performed at [facility], [address] [place], [place] [address]  Hepatic function panel     Status: Abnormal   Collection Time: 08/27/18  4:56 PM  Result Value Ref Range   Total Protein 6.3 (L) 6.5 - 8.1 g/dL   Albumin 3.1 (L) 3.5 - 5.0 g/dL   AST 14 (L) 15 - 41 U/L   ALT 10 0 - 44 U/L   Alkaline Phosphatase 40 38 - 126 U/L   Total Bilirubin 0.7 0.3 - 1.2 mg/dL   Bilirubin, Direct 0.1 0.0 - 0.2 mg/dL   Indirect Bilirubin 0.6 0.3 - 0.9 mg/dL    Comment: Performed at [facility], [address], [place], [place] [address]    Ct Chest Wo Contrast  Result Date: 08/27/2018 CLINICAL DATA:  Cough and chest pain. Pneumonia. Multifocal opacities on radiograph. EXAM: CT CHEST WITHOUT CONTRAST TECHNIQUE: Multidetector CT imaging of the chest was performed following the standard protocol without IV contrast. COMPARISON:  Radiograph earlier this day. FINDINGS: Cardiovascular: Normal caliber thoracic aorta. Heart is normal in size. No pericardial effusion. Mediastinum/Nodes: Mild multifocal mediastinal adenopathy. Right lower paratracheal node measures 11 mm short axis. Additional prominent mediastinal nodes. Lack of IV contrast limits assessment for hilar adenopathy. Triangular soft tissue density in the anterior mediastinum consistent with residual thymus, not unexpected for age. Lungs/Pleura: Multifocal ground-glass and consolidative opacities throughout all lobes of both lungs. Mild smooth septal thickening. There are small bilateral pleural effusions. Trachea and mainstem bronchi are patent. Upper Abdomen: No acute abnormality. Musculoskeletal: There are no acute or suspicious osseous abnormalities. IMPRESSION: Multifocal consolidative and ground-glass  opacities throughout all lobes of both  lungs. These findings are most consistent with pneumonia including atypical infection and viral pneumonia. However, the presence of bilateral pleural effusions and mediastinal adenopathy are atypical or uncommonly reported for COVID-19. Alternative diagnosis should be considered. Electronically Signed   By: Narda Rutherford M.D.   On: 08/27/2018 20:11   Dg Chest Port 1 View  Result Date: 08/26/2018 CLINICAL DATA:  Shortness of breath with cough and fever EXAM: PORTABLE CHEST 1 VIEW COMPARISON:  None. FINDINGS: There is widespread airspace opacity throughout the mid and lower lung zones, somewhat more severe on the right than on the left. Heart size and pulmonary vascularity are normal. No adenopathy. No bone lesions. IMPRESSION: Extensive apparent multifocal pneumonia, more severe on the right than on the left. Atypical organism pneumonia is a differential consideration given this appearance. No adenopathy evident. Electronically Signed   By: Bretta Bang III M.D.   On: 08/26/2018 16:51    Review of Systems  Constitutional: Positive for chills, fever and malaise/fatigue.  HENT: Positive for sore throat.   Eyes: Negative.   Respiratory: Positive for cough and shortness of breath. Negative for hemoptysis and sputum production.   Cardiovascular: Positive for chest pain.  Gastrointestinal: Negative.   Genitourinary: Negative.   Musculoskeletal: Positive for myalgias.  Skin: Negative.   Neurological: Negative.   Endo/Heme/Allergies: Negative.   Psychiatric/Behavioral: Negative.   All other systems reviewed and are negative.  Blood pressure 116/71, pulse 97, temperature 99 F (37.2 C), temperature source Oral, resp. rate (!) 28, height 5\' 10"  (1.778 m), weight 68 kg, SpO2 96 %. Physical Exam  Constitutional: He is oriented to person, place, and time. He appears well-developed and well-nourished. No distress.  HENT:  Head: Normocephalic and atraumatic.   Right Ear: External ear normal.  Left Ear: External ear normal.  Nose: Nose normal.  Mouth/Throat: Oropharynx is clear and moist. No oropharyngeal exudate.  Eyes: Pupils are equal, round, and reactive to light. Conjunctivae and EOM are normal. No scleral icterus.  Neck: Neck supple. No JVD present. No tracheal deviation present. No thyromegaly present.  Cardiovascular: Regular rhythm and normal pulses. Tachycardia present.  Patient was initially tachycardic and febrile, tachycardia resolved with control of fever.  Respiratory: No accessory muscle usage. Tachypnea noted. No respiratory distress.  Because of need for extensive PPE given COVID-19, auscultation of the lungs was not pursued.  POCUS the lungs was performed.  Both lungs were examined using ultrasound.  Finding was consistent with viral pneumonia the patient had pleural thickening bilaterally with irregular pleural contour.  Frequent B-lines noted at times confluent indicating consolidation.  Areas of consolidation appear to be scattered.  There were some subtle clear subpleural consolidations noted.  These findings are consistent with viral pneumonitis.  GI: Soft. Normal appearance. He exhibits no distension. There is no abdominal tenderness. There is no guarding.  Musculoskeletal: Normal range of motion.        General: No deformity or edema.  Lymphadenopathy:    He has no cervical adenopathy.  Neurological: He is alert and oriented to person, place, and time.  No overt focal deficits noted.  Skin: Skin is warm and dry.  Psychiatric: His behavior is normal.    Assessment/Plan:  1. Acute hypoxic respiratory failure in the setting of pneumonitis likely viral pneumonitis.  COVID-19 being considered.  Continue oxygen supplementation consider transitioning to high flow O2 early. Recommend prone positioning as patient tolerates 2 hours prone two hours supine as he is able to tolerate for approximately 16 hours.  Recommend incentive  spirometry when supine.  2.  Acute febrile illness with pneumonitis: COVID-19 being ruled out.  Use acetaminophen for control of fever.  In the setting of potential COVID-19 continue hydroxychloroquine.  Patient is on a azithromycin and ceftriaxone for potential superimposed bacterial infection.  Monitor for potential cytokine storm.  Initiate colchicine 0.6 mg twice a day as per [place] trial on COVID-19.  3.  Tachycardia due to fever: Control  fever.  Use acetaminophen, avoid nonsteroidals.   I updated the patient's father via phone.  Gailen Shelter. Laura , MD [facility]  08/27/2018, 10:39 PM

## 2018-08-27 NOTE — Consult Note (Addendum)
NAME: Anthony Pugh  DOB: [birth year]  MRN: [id number]  Date/Time: 08/27/2018 2:01 PM  REQUESTING PROVIDER: Allena Katz Subjective:  REASON FOR CONSULT: covid ?spoke to patient in spanish  with help  google translate   Anthony Pugh Jerrilyn Cairo is a 23 y.o. male from [place] in the [place] for the past year with no medical history presents with cough , chest pain and fever of 3 days. 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 This Tuesday he developed a cough and also started having pain in his chest , it got worse and he became sob and also started with fever. As he was not getting better he went to urgent care and pulse ox was in the 80s and he was sent to the ED. He lives with his dad, brother, aunt and cousin. No one is sick at home- He does not know of anyone sick at his work- He did not travel he says Pt thinks that he got sick because he started smoking 20 days ago- smokes 5 /day-does not vape   PMH None  PSH None  SH -lives with his dad and brother and other family members Smoking since 20 days  drinks beer  FH- says his dad does not have any issues ? Current Facility-Administered Medications  Medication Dose Route Frequency Provider Last Rate Last Dose  . 0.9 %  sodium chloride infusion   Intravenous Continuous Mayo, Allyn Kenner, MD 100 mL/hr at 08/27/18 1200    . acetaminophen (TYLENOL) tablet 650 mg  650 mg Oral Q6H PRN Auburn Bilberry, MD   650 mg at 08/27/18 0544  . ceFEPIme (MAXIPIME) 2 g in sodium chloride 0.9 % 100 mL IVPB  2 g Intravenous Q8H Albina Billet, RPH   Stopped at 08/27/18 1026  . enoxaparin (LOVENOX) injection 40 mg  40 mg Subcutaneous Q24H Bertram Savin, RPH      . hydroxychloroquine (PLAQUENIL) tablet 200 mg  200 mg Oral BID Auburn Bilberry, MD      . ondansetron [facility]) tablet 4 mg  4 mg Oral Q6H PRN Auburn Bilberry, MD       Or  . ondansetron [facility]) injection 4 mg  4 mg Intravenous Q6H PRN Auburn Bilberry, MD      . vancomycin (VANCOCIN) 1,250 mg in sodium chloride 0.9 % 250 mL IVPB  1,250 mg Intravenous Q12H Albina Billet, RPH 166.7 mL/hr at 08/27/18 1129 1,250 mg at 08/27/18 1129     Abtx:  Anti-infectives (From admission, onward)   Start     Dose/Rate Route Frequency Ordered Stop   08/27/18 2000  hydroxychloroquine (PLAQUENIL) tablet 200 mg     200 mg Oral 2 times daily 08/26/18 2034 08/31/18 1959   08/27/18 0800  vancomycin (VANCOCIN) 1,250 mg in sodium chloride 0.9 % 250 mL IVPB     1,250 mg 166.7 mL/hr over 90 Minutes Intravenous Every 12 hours 08/26/18 1942     08/27/18 0000  ceFEPIme (MAXIPIME) 2 g in sodium chloride 0.9 % 100 mL IVPB     2 g 200 mL/hr over 30 Minutes Intravenous Every 8 hours 08/26/18 1942     08/26/18 2045  hydroxychloroquine (PLAQUENIL) tablet 400 mg     400 mg Oral 2 times daily 08/26/18 2034 08/27/18 0946   08/26/18 1630  ceFEPIme (MAXIPIME) 2 g in sodium chloride 0.9 % 100 mL IVPB     2 g 200 mL/hr over 30 Minutes Intravenous  Once 08/26/18 1628 08/26/18 1725   08/26/18 1630  metroNIDAZOLE (FLAGYL) IVPB 500 mg     500 mg 100 mL/hr over 60 Minutes Intravenous  Once 08/26/18 1628 08/26/18 1805   08/26/18 1630  vancomycin (VANCOCIN) IVPB 1000 mg/200 mL premix     1,000 mg 200 mL/hr over 60 Minutes Intravenous  Once 08/26/18 1628 08/26/18 1852      REVIEW OF SYSTEMS:  Const:  fever,  chills, negative weight loss Eyes: negative diplopia or visual changes, negative eye pain ENT: negative coryza, negative sore throat Resp: ++ cough dry , no hemoptysis, ++dyspnea Cards: + chest pain at the center , palpitations, no lower extremity edema GU: negative for frequency, dysuria and hematuria GI: Negative for abdominal pain, diarrhea, bleeding, constipation Skin: negative for rash and pruritus Heme: negative for easy bruising and gum/nose bleeding MS: ++ myalgias, no arthralgias, back pain and muscle weakness Neurolo:negative for headaches,  dizziness, vertigo, memory problems  Psych: negative for feelings of anxiety, depression  Endocrine: negative for thyroid, diabetes issues Allergy/Immunology- negative for any medication or food allergies ? Objective:  VITALS:  BP 122/88   Pulse (!) 117   Temp 98.8 F (37.1 C) (Oral)   Resp (!) 25   Ht  (1.778 m)   Wt 68 kg   SpO2 100%   BMI 21.52 kg/m  PHYSICAL EXAM:  General: Alert, cooperative, coughs when he talks, on nasal oxygen Head: Normocephalic, without obvious abnormality, atraumatic. Eyes: Conjunctivae clear, anicteric sclerae. Pupils are equal ENT Nares normal. No drainage or sinus tenderness. Lips, mucosa, and tongue normal. No Thrush Neck: Supple, symmetrical, no adenopathy, thyroid: non tender no carotid bruit and no JVD. Lungs: b/l air entry- crepts Heart: tachycardia Abdomen: Soft, non-tender,not distended. Bowel sounds normal. No masses Extremities: atraumatic, no cyanosis. No edema. No clubbing Skin: No rashes or lesions. Or bruising Lymph: Cervical, supraclavicular normal. Neurologic: Grossly non-focal Pertinent Labs Lab Results CBC    Component Value Date/Time   WBC 14.2 (H) 08/27/2018 1212   RBC 4.37 08/27/2018 1212   HGB 13.7 08/27/2018 1212   HCT 40.1 08/27/2018 1212   PLT 197 08/27/2018 1212   MCV 91.8 08/27/2018 1212   MCH 31.4 08/27/2018 1212   MCHC 34.2 08/27/2018 1212   RDW 12.4 08/27/2018 1212   LYMPHSABS 0.8 08/26/2018 1634   MONOABS 0.9 08/26/2018 1634   EOSABS 0.2 08/26/2018 1634   BASOSABS 0.0 08/26/2018 1634    CMP Latest Ref Rng & Units 08/27/2018 08/26/2018  Glucose 70 - 99 mg/dL 914(N) 829(F)  BUN 6 - 20 mg/dL 7 9  Creatinine 6.21 - 1.24 mg/dL 3.08 6.57  Sodium 846 - 145 mmol/L 136 136  Potassium 3.5 - 5.1 mmol/L 3.7 3.8  Chloride 98 - 111 mmol/L 106 102  CO2 22 - 32 mmol/L 20(L) 23  Calcium 8.9 - 10.3 mg/dL 7.9(L) 8.4(L)  Total Protein 6.5 - 8.1 g/dL 6.2(L) 7.1  Total Bilirubin 0.3 - 1.2 mg/dL 1.1 9.6(E)   Alkaline Phos 38 - 126 U/L 43 51  AST 15 - 41 U/L 13(L) 13(L)  ALT 0 - 44 U/L 10 13      Microbiology: Recent Results (from the past 240 hour(s))  Blood Culture (routine x 2)     Status: None (Preliminary result)   Collection Time: 08/26/18  4:34 PM  Result Value Ref Range Status   Specimen Description BLOOD RIGHT ANTECUBITAL  Final   Special Requests   Final    BOTTLES DRAWN AEROBIC AND  ANAEROBIC Blood Culture adequate volume   Culture   Final    NO GROWTH < 24 HOURS Performed at [facility], [address] [place], [place] [address]    Report Status PENDING  Incomplete  Blood Culture (routine x 2)     Status: None (Preliminary result)   Collection Time: 08/26/18  4:34 PM  Result Value Ref Range Status   Specimen Description BLOOD BLOOD RIGHT ARM  Final   Special Requests   Final    BOTTLES DRAWN AEROBIC AND ANAEROBIC Blood Culture results may not be optimal due to an excessive volume of blood received in culture bottles   Culture   Final    NO GROWTH < 24 HOURS Performed at [facility], [address] [place], [place] [address]    Report Status PENDING  Incomplete  Respiratory Panel by PCR     Status: None   Collection Time: 08/26/18  4:34 PM  Result Value Ref Range Status   Adenovirus NOT DETECTED NOT DETECTED Final   Coronavirus 229E NOT DETECTED NOT DETECTED Final    Comment: (NOTE) The Coronavirus on the Respiratory Panel, DOES NOT test for the novel  Coronavirus (2019 nCoV)    Coronavirus HKU1 NOT DETECTED NOT DETECTED Final   Coronavirus NL63 NOT DETECTED NOT DETECTED Final   Coronavirus OC43 NOT DETECTED NOT DETECTED Final   Metapneumovirus NOT DETECTED NOT DETECTED Final   Rhinovirus / Enterovirus NOT DETECTED NOT DETECTED Final   Influenza A NOT DETECTED NOT DETECTED Final   Influenza B NOT DETECTED NOT DETECTED Final   Parainfluenza Virus 1 NOT DETECTED NOT DETECTED Final   Parainfluenza Virus 2 NOT DETECTED NOT DETECTED Final    Parainfluenza Virus 3 NOT DETECTED NOT DETECTED Final   Parainfluenza Virus 4 NOT DETECTED NOT DETECTED Final   Respiratory Syncytial Virus NOT DETECTED NOT DETECTED Final   Bordetella pertussis NOT DETECTED NOT DETECTED Final   Chlamydophila pneumoniae NOT DETECTED NOT DETECTED Final   Mycoplasma pneumoniae NOT DETECTED NOT DETECTED Final    Comment: Performed at [facility], [address], [place], [place] [address]  C difficile quick scan w PCR reflex     Status: None   Collection Time: 08/27/18 12:08 PM  Result Value Ref Range Status   C Diff antigen NEGATIVE NEGATIVE Final   C Diff toxin NEGATIVE NEGATIVE Final   C Diff interpretation No C. difficile detected.  Final    Comment: Performed at [facility], [address] [place], [place] [address]    IMAGING RESULTS:  I have personally reviewed the films ?B/l infiltrates Impression/Recommendation ? ?Pt presenting with 3 day h/o cough, fever and sob B/l infiltrates in the lungs  COVID 19 highly likely- D.D CAP On Hydroxychloroquine,  Change cefepime and vanco to ceftriaxone + zithromax. If covid test results positive will stop ceftriaxone ?Will get EKG to look for QTc interval Observe closely for cytokine storm ___________________________________________________ Discussed with patient.

## 2018-08-28 ENCOUNTER — Inpatient Hospital Stay: Payer: Self-pay

## 2018-08-28 DIAGNOSIS — J90 Pleural effusion, not elsewhere classified: Secondary | ICD-10-CM

## 2018-08-28 DIAGNOSIS — R6889 Other general symptoms and signs: Secondary | ICD-10-CM

## 2018-08-28 DIAGNOSIS — J189 Pneumonia, unspecified organism: Principal | ICD-10-CM

## 2018-08-28 DIAGNOSIS — J9601 Acute respiratory failure with hypoxia: Secondary | ICD-10-CM

## 2018-08-28 LAB — BASIC METABOLIC PANEL
Anion gap: 11 (ref 5–15)
BUN: 10 mg/dL (ref 6–20)
CO2: 21 mmol/L — ABNORMAL LOW (ref 22–32)
Calcium: 8.2 mg/dL — ABNORMAL LOW (ref 8.9–10.3)
Chloride: 106 mmol/L (ref 98–111)
Creatinine, Ser: 0.61 mg/dL (ref 0.61–1.24)
GFR calc Af Amer: >60 mL/min (ref >60)
GFR calc non Af Amer: >60 mL/min (ref >60)
Glucose, Bld: 96 mg/dL (ref 70–99)
Potassium: 3.7 mmol/L (ref 3.5–5.1)
Sodium: 138 mmol/L (ref 135–145)

## 2018-08-28 LAB — HEPATIC FUNCTION PANEL
ALT: 10 U/L (ref 0–44)
AST: 12 U/L — ABNORMAL LOW (ref 15–41)
Albumin: 3 g/dL — ABNORMAL LOW (ref 3.5–5.0)
Alkaline Phosphatase: 40 U/L (ref 38–126)
Bilirubin, Direct: 0.1 mg/dL (ref 0.0–0.2)
Indirect Bilirubin: 0.7 mg/dL (ref 0.3–0.9)
Total Bilirubin: 0.8 mg/dL (ref 0.3–1.2)
Total Protein: 6.6 g/dL (ref 6.5–8.1)

## 2018-08-28 LAB — HIV ANTIBODY (ROUTINE TESTING W REFLEX): HIV Screen 4th Generation wRfx: NONREACTIVE

## 2018-08-28 LAB — CBC
HCT: 39.1 % (ref 39.0–52.0)
Hemoglobin: 13.3 g/dL (ref 13.0–17.0)
MCH: 30.9 pg (ref 26.0–34.0)
MCHC: 34 g/dL (ref 30.0–36.0)
MCV: 90.9 fL (ref 80.0–100.0)
Platelets: 228 10*3/uL (ref 150–400)
RBC: 4.3 MIL/uL (ref 4.22–5.81)
RDW: 12.2 % (ref 11.5–15.5)
WBC: 11.2 10*3/uL — ABNORMAL HIGH (ref 4.0–10.5)
nRBC: 0 % (ref 0.0–0.2)

## 2018-08-28 LAB — FERRITIN: Ferritin: 387 ng/mL — ABNORMAL HIGH (ref 24–336)

## 2018-08-28 MED ORDER — TOCILIZUMAB 400 MG/20ML IV SOLN
400.0000 mg | Freq: Once | INTRAVENOUS | Status: AC
  Administered 2018-08-28: 400 mg via INTRAVENOUS
  Filled 2018-08-28: qty 20

## 2018-08-28 MED ORDER — HYDROCOD POLST-CPM POLST ER 10-8 MG/5ML PO SUER
5.0000 mL | Freq: Two times a day (BID) | ORAL | Status: DC
  Administered 2018-08-28: 5 mL via ORAL
  Filled 2018-08-28: qty 5

## 2018-08-28 MED ORDER — MORPHINE SULFATE (PF) 2 MG/ML IV SOLN
1.0000 mg | Freq: Once | INTRAVENOUS | Status: AC
  Administered 2018-08-28: 1 mg via INTRAVENOUS
  Filled 2018-08-28: qty 1

## 2018-08-28 MED ORDER — HYDROCOD POLST-CPM POLST ER 10-8 MG/5ML PO SUER
5.0000 mL | Freq: Two times a day (BID) | ORAL | Status: DC
  Administered 2018-08-28 – 2018-09-01 (×8): 5 mL via ORAL
  Filled 2018-08-28 (×8): qty 5

## 2018-08-28 NOTE — Progress Notes (Signed)
Date of Admission:  08/26/2018     ID: Anthony Pugh is a 23 y.o. male with  Active Problems:   Multifocal pneumonia    Subjective: Still has cough But no fever Poor appetite  Medications:  . colchicine  0.6 mg Oral BID  . enoxaparin (LOVENOX) injection  40 mg Subcutaneous Q24H  . hydroxychloroquine  200 mg Oral BID  . vitamin C  500 mg Oral Daily  . zinc sulfate  220 mg Oral Daily    Objective: Vital signs in last 24 hours: Temp:  [98.8 F (37.1 C)-101.6 F (38.7 C)] 99.2 F (37.3 C) (04/10 0200) Pulse Rate:  [91-128] 107 (04/10 0400) Resp:  [21-40] 37 (04/10 0400) BP: (106-147)/(59-105) 140/83 (04/10 0400) SpO2:  [92 %-100 %] 94 % (04/10 0400) FiO2 (%):  [36 %] 36 % (04/09 1940)  PHYSICAL EXAM:  General: Alert, cooperative, no distress, on Oxygen  Lung and heart not examined due to PPE Lab Results Recent Labs    08/27/18 1212 08/27/18 1656 08/28/18 0541  WBC 14.2*  --  11.2*  HGB 13.7  --  13.3  HCT 40.1  --  39.1  NA  --  137 138  K  --  3.7 3.7  CL  --  105 106  CO2  --  23 21*  BUN  --  9 10  CREATININE  --  0.79 0.61   Liver Panel Recent Labs    08/27/18 1656 08/28/18 0541  PROT 6.3* 6.6  ALBUMIN 3.1* 3.0*  AST 14* 12*  ALT 10 10  ALKPHOS 40 40  BILITOT 0.7 0.8  BILIDIR 0.1 0.1  IBILI 0.6 0.7   Sedimentation Rate No results for input(s): ESRSEDRATE in the last 72 hours. C-Reactive Protein Recent Labs    08/27/18 0629  CRP 23.9*    Microbiology:  Studies/Results: Ct Chest Wo Contrast  Result Date: 08/27/2018 CLINICAL DATA:  Cough and chest pain. Pneumonia. Multifocal opacities on radiograph. EXAM: CT CHEST WITHOUT CONTRAST TECHNIQUE: Multidetector CT imaging of the chest was performed following the standard protocol without IV contrast. COMPARISON:  Radiograph earlier this day. FINDINGS: Cardiovascular: Normal caliber thoracic aorta. Heart is normal in size. No pericardial effusion. Mediastinum/Nodes: Mild multifocal  mediastinal adenopathy. Right lower paratracheal node measures 11 mm short axis. Additional prominent mediastinal nodes. Lack of IV contrast limits assessment for hilar adenopathy. Triangular soft tissue density in the anterior mediastinum consistent with residual thymus, not unexpected for age. Lungs/Pleura: Multifocal ground-glass and consolidative opacities throughout all lobes of both lungs. Mild smooth septal thickening. There are small bilateral pleural effusions. Trachea and mainstem bronchi are patent. Upper Abdomen: No acute abnormality. Musculoskeletal: There are no acute or suspicious osseous abnormalities. IMPRESSION: Multifocal consolidative and ground-glass opacities throughout all lobes of both lungs. These findings are most consistent with pneumonia including atypical infection and viral pneumonia. However, the presence of bilateral pleural effusions and mediastinal adenopathy are atypical or uncommonly reported for COVID-19. Alternative diagnosis should be considered. Electronically Signed   By: Narda RutherfordMelanie  Sanford M.D.   On: 08/27/2018 20:11   Dg Chest Port 1 View  Result Date: 08/28/2018 CLINICAL DATA:  Followup pneumonia EXAM: PORTABLE CHEST 1 VIEW COMPARISON:  08/26/2018 FINDINGS: Widespread bilateral patchy pulmonary infiltrates persist, with pleural effusions right larger than left. There is slight radiographic worsening of these findings. No qualitatively new finding. IMPRESSION: Slight radiographic worsening of widespread bilateral patchy pulmonary infiltrates and pleural effusions. Findings could be secondary to bacterial or viral pneumonia.  Electronically Signed   By: Paulina Fusi M.D.   On: 08/28/2018 05:32   Dg Chest Port 1 View  Result Date: 08/26/2018 CLINICAL DATA:  Shortness of breath with cough and fever EXAM: PORTABLE CHEST 1 VIEW COMPARISON:  None. FINDINGS: There is widespread airspace opacity throughout the mid and lower lung zones, somewhat more severe on the right than on  the left. Heart size and pulmonary vascularity are normal. No adenopathy. No bone lesions. IMPRESSION: Extensive apparent multifocal pneumonia, more severe on the right than on the left. Atypical organism pneumonia is a differential consideration given this appearance. No adenopathy evident. Electronically Signed   By: Bretta Bang III M.D.   On: 08/26/2018 16:51     Assessment/Plan: ?Pt presenting with 3 day h/o cough, fever and sob B/l infiltrates in the lungs  COVID 19 highly likely- test pending On Hydroxychloroquine,   ceftriaxone + zithromax. If covid test results positive will stop ceftriaxone QtC < 500 Received a dose of 400mg  of Tocilizumab Has rt pleural effusion on CXR which is a little unusual for COVID unless it is  fluid overload Discussed with his nurse

## 2018-08-28 NOTE — Progress Notes (Signed)
[facility] [place] [facility]   PATIENT NAME: Anthony Pugh    MR#:  [id number]  DATE OF BIRTH:  07-17-1995  SUBJECTIVE:  CHIEF COMPLAINT:   Chief Complaint  Patient presents with   Fever   Cough   -Still complains of significant dry cough.  On 4 L oxygen -Being monitored in ICU as became  tachypneic on the floors  REVIEW OF SYSTEMS:  Review of Systems  Constitutional: Negative for chills, fever and malaise/fatigue.  HENT: Negative for congestion, ear discharge, hearing loss and nosebleeds.   Eyes: Negative for blurred vision and double vision.  Respiratory: Positive for cough and shortness of breath. Negative for wheezing.   Cardiovascular: Positive for chest pain. Negative for palpitations and leg swelling.  Gastrointestinal: Positive for diarrhea. Negative for abdominal pain, constipation, nausea and vomiting.  Genitourinary: Negative for dysuria.  Musculoskeletal: Negative for myalgias.  Neurological: Negative for dizziness, focal weakness, seizures and headaches.  Psychiatric/Behavioral: Negative for depression.    DRUG ALLERGIES:  Not on File  VITALS:  Blood pressure 126/75, pulse (!) 117, temperature 99.6 F (37.6 C), resp. rate (!) 21, height  (1.778 m), weight 68 kg, SpO2 96 %.  PHYSICAL EXAMINATION:  Physical Exam   GENERAL:  23 y.o.-year-old patient lying in the bed with no acute distress.  EYES: Pupils equal, round, reactive to light and accommodation. No scleral icterus. Extraocular muscles intact.  HEENT: Head atraumatic, normocephalic. Oropharynx and nasopharynx clear.  NECK:  Supple, no jugular venous distention. No thyroid enlargement, no tenderness.  LUNGS: moving air bilaterally, left basilar crackles and rhonchi, no wheezing,   or crepitation. No use of accessory muscles of respiration.  Diminished right basilar breath sounds CARDIOVASCULAR: S1, S2 rapid and regular. No murmurs, rubs, or gallops.    ABDOMEN: Soft, nontender, nondistended. Bowel sounds present. No organomegaly or mass.  EXTREMITIES: No pedal edema, cyanosis, or clubbing.  NEUROLOGIC: Cranial nerves II through XII are intact. Muscle strength 5/5 in all extremities. Sensation intact. Gait not checked.  PSYCHIATRIC: The patient is alert and oriented x 3.  SKIN: No obvious rash, lesion, or ulcer.    LABORATORY PANEL:   CBC Recent Labs  Lab 08/28/18 0541  WBC 11.2*  HGB 13.3  HCT 39.1  PLT 228   ------------------------------------------------------------------------------------------------------------------  Chemistries  Recent Labs  Lab 08/27/18 0629  08/28/18 0541  NA  --    < > 138  K  --    < > 3.7  CL  --    < > 106  CO2  --    < > 21*  GLUCOSE  --    < > 96  BUN  --    < > 10  CREATININE  --    < > 0.61  CALCIUM  --    < > 8.2*  MG 2.1  --   --   AST 13*   < > 12*  ALT 10   < > 10  ALKPHOS 43   < > 40  BILITOT 1.1   < > 0.8   < > = values in this interval not displayed.   ------------------------------------------------------------------------------------------------------------------  Cardiac Enzymes No results for input(s): TROPONINI in the last 168 hours. ------------------------------------------------------------------------------------------------------------------  RADIOLOGY:  Ct Chest Wo Contrast  Result Date: 08/27/2018 CLINICAL DATA:  Cough and chest pain. Pneumonia. Multifocal opacities on radiograph. EXAM: CT CHEST WITHOUT CONTRAST TECHNIQUE: Multidetector CT imaging of the chest was performed following the standard protocol  without IV contrast. COMPARISON:  Radiograph earlier this day. FINDINGS: Cardiovascular: Normal caliber thoracic aorta. Heart is normal in size. No pericardial effusion. Mediastinum/Nodes: Mild multifocal mediastinal adenopathy. Right lower paratracheal node measures 11 mm short axis. Additional prominent mediastinal nodes. Lack of IV contrast limits assessment  for hilar adenopathy. Triangular soft tissue density in the anterior mediastinum consistent with residual thymus, not unexpected for age. Lungs/Pleura: Multifocal ground-glass and consolidative opacities throughout all lobes of both lungs. Mild smooth septal thickening. There are small bilateral pleural effusions. Trachea and mainstem bronchi are patent. Upper Abdomen: No acute abnormality. Musculoskeletal: There are no acute or suspicious osseous abnormalities. IMPRESSION: Multifocal consolidative and ground-glass opacities throughout all lobes of both lungs. These findings are most consistent with pneumonia including atypical infection and viral pneumonia. However, the presence of bilateral pleural effusions and mediastinal adenopathy are atypical or uncommonly reported for COVID-19. Alternative diagnosis should be considered. Electronically Signed   By: Narda Rutherford M.D.   On: 08/27/2018 20:11   Dg Chest Port 1 View  Result Date: 08/28/2018 CLINICAL DATA:  Followup pneumonia EXAM: PORTABLE CHEST 1 VIEW COMPARISON:  08/26/2018 FINDINGS: Widespread bilateral patchy pulmonary infiltrates persist, with pleural effusions right larger than left. There is slight radiographic worsening of these findings. No qualitatively new finding. IMPRESSION: Slight radiographic worsening of widespread bilateral patchy pulmonary infiltrates and pleural effusions. Findings could be secondary to bacterial or viral pneumonia. Electronically Signed   By: Paulina Fusi M.D.   On: 08/28/2018 05:32   Dg Chest Port 1 View  Result Date: 08/26/2018 CLINICAL DATA:  Shortness of breath with cough and fever EXAM: PORTABLE CHEST 1 VIEW COMPARISON:  None. FINDINGS: There is widespread airspace opacity throughout the mid and lower lung zones, somewhat more severe on the right than on the left. Heart size and pulmonary vascularity are normal. No adenopathy. No bone lesions. IMPRESSION: Extensive apparent multifocal pneumonia, more severe on  the right than on the left. Atypical organism pneumonia is a differential consideration given this appearance. No adenopathy evident. Electronically Signed   By: Bretta Bang III M.D.   On: 08/26/2018 16:51    EKG:   Orders placed or performed during the hospital encounter of 08/26/18   ED EKG 12-Lead   ED EKG 12-Lead    ASSESSMENT AND PLAN:   23 year old no significant past medical history presents to hospital secondary to chest pain, shortness of breath and fever.  1.  Acute hypoxic respiratory failure-multilobar pneumonia - or likely COVID-19 infection -On high risk precautions -COVID-19 test is pending. -Appreciate ID consult. -Patient currently on 4 L supplemental oxygen.  Cough meds added -On antibiotics with Rocephin.  Vancomycin and cefepime discontinued.  They are placing him in prone position for 2 hours alternating with supine position. -Started on hydroxychloroquine.  Colchicine has been added to prevent cytokine storm. -Also received a dose of Actemra today -HIV test is negative.  Flu and respiratory panel is negative.   2.  Leukocytosis-secondary to pneumonia, improving  3.  DVT prophylaxis-Lovenox  4.  Hypokalemia-replaced   All the records are reviewed and case discussed with Care Management/Social Workerr. Management plans discussed with the patient, family and they are in agreement.  CODE STATUS: Full Code  TOTAL TIME TAKING CARE OF THIS PATIENT: 36 minutes.   POSSIBLE D/C IN 2-3 DAYS, DEPENDING ON CLINICAL CONDITION.   Enid Baas M.D on 08/28/2018 at 12:34 PM  Between 7am to 6pm - Pager - [phone]  After 6pm go to [email or web address] -  password [email or web address]  [facility]  Office  [phone]  CC: Primary care physician; Patient, No Pcp Per

## 2018-08-28 NOTE — Progress Notes (Signed)
This is a classic presentation and radiographic appearance of COVID-19.  We are waiting test results of nasopharyngeal swab.  I feel certain that he has the diagnosis.  He is presently comfortable on nasal cannula oxygen.  Vitals:   08/28/18 0800 08/28/18 0900 08/28/18 1000 08/28/18 1100  BP: 126/75     Pulse: (!) 121 (!) 117 (!) 116 (!) 117  Resp: (!) 34 (!) 29 (!) 37 (!) 21  Temp: 99.3 F (37.4 C)  99.6 F (37.6 C)   TempSrc: Oral     SpO2: 100% 94% 94% 96%  Weight:      Height:      4 LPM [place]  To limit exposure, I did not enter room.  He appears comfortable from doorway.  BMP Latest Ref Rng & Units 08/28/2018 08/27/2018 08/27/2018  Glucose 70 - 99 mg/dL 96 160(V) 371(G)  BUN 6 - 20 mg/dL 10 9 7   Creatinine 0.61 - 1.24 mg/dL 6.26 9.48 5.46  Sodium 135 - 145 mmol/L 138 137 136  Potassium 3.5 - 5.1 mmol/L 3.7 3.7 3.7  Chloride 98 - 111 mmol/L 106 105 106  CO2 22 - 32 mmol/L 21(L) 23 20(L)  Calcium 8.9 - 10.3 mg/dL 8.2(L) 8.1(L) 7.9(L)   CBC Latest Ref Rng & Units 08/28/2018 08/27/2018 08/26/2018  WBC 4.0 - 10.5 K/uL 11.2(H) 14.2(H) 17.5(H)  Hemoglobin 13.0 - 17.0 g/dL 27.0 35.0 09.3  Hematocrit 39.0 - 52.0 % 39.1 40.1 42.7  Platelets 150 - 400 K/uL 228 197 215   CXR: Bilateral patchy airspace disease  IMPRESSION: Acute hypoxemic respiratory failure Acute pneumonitis, likely COVID-19 (almost certainly this is the diagnosis)  PLAN/REC: Continue supplemental oxygen to maintain SPO2 greater than 90% Continue close monitoring and ICU/SDU Encouraged him to lie in prone position intermittently throughout day and night Continue hydroxychloroquine/azithromycin/zinc Continue colchicine Avoid hypervolemia.  Normal saline discontinued Continue ceftriaxone for now.  Repeat PCT in a.m.  If remains low, will discontinue ceftriaxone Tocilizumab administered today per my recommendation  Billy Fischer, MD PCCM service Mobile [phone] Pager [phone] 08/28/2018 3:13 PM

## 2018-08-29 LAB — CBC WITH DIFFERENTIAL/PLATELET
Abs Immature Granulocytes: 0.19 10*3/uL — ABNORMAL HIGH (ref 0.00–0.07)
Basophils Absolute: 0 10*3/uL (ref 0.0–0.1)
Basophils Relative: 0 %
Eosinophils Absolute: 0.7 10*3/uL — ABNORMAL HIGH (ref 0.0–0.5)
Eosinophils Relative: 9 %
HCT: 40 % (ref 39.0–52.0)
Hemoglobin: 14.1 g/dL (ref 13.0–17.0)
Immature Granulocytes: 2 %
Lymphocytes Relative: 21 %
Lymphs Abs: 1.6 10*3/uL (ref 0.7–4.0)
MCH: 31.4 pg (ref 26.0–34.0)
MCHC: 35.3 g/dL (ref 30.0–36.0)
MCV: 89.1 fL (ref 80.0–100.0)
Monocytes Absolute: 0.7 10*3/uL (ref 0.1–1.0)
Monocytes Relative: 8 %
Neutro Abs: 4.8 10*3/uL (ref 1.7–7.7)
Neutrophils Relative %: 60 %
Platelets: 228 10*3/uL (ref 150–400)
RBC: 4.49 MIL/uL (ref 4.22–5.81)
RDW: 12 % (ref 11.5–15.5)
WBC: 8 10*3/uL (ref 4.0–10.5)
nRBC: 0 % (ref 0.0–0.2)

## 2018-08-29 LAB — PROCALCITONIN: Procalcitonin: <0.1 ng/mL

## 2018-08-29 LAB — HEPATIC FUNCTION PANEL
ALT: 13 U/L (ref 0–44)
AST: 15 U/L (ref 15–41)
Albumin: 2.9 g/dL — ABNORMAL LOW (ref 3.5–5.0)
Alkaline Phosphatase: 40 U/L (ref 38–126)
Bilirubin, Direct: <0.1 mg/dL (ref 0.0–0.2)
Total Bilirubin: 0.5 mg/dL (ref 0.3–1.2)
Total Protein: 6.5 g/dL (ref 6.5–8.1)

## 2018-08-29 LAB — FERRITIN: Ferritin: 445 ng/mL — ABNORMAL HIGH (ref 24–336)

## 2018-08-29 LAB — LACTATE DEHYDROGENASE: LDH: 131 U/L (ref 98–192)

## 2018-08-29 LAB — C-REACTIVE PROTEIN: CRP: 10.9 mg/dL — ABNORMAL HIGH (ref <1.0)

## 2018-08-29 MED ORDER — COLCHICINE 0.6 MG PO TABS
0.6000 mg | ORAL_TABLET | Freq: Two times a day (BID) | ORAL | Status: DC
  Administered 2018-08-29 – 2018-08-31 (×4): 0.6 mg via ORAL
  Filled 2018-08-29 (×5): qty 1

## 2018-08-29 NOTE — Progress Notes (Signed)
Patient admitted to room 232. Patient currently on 4 L O2. O2 Sats 94-96%. HR continues to be elevated in the low 100s specially when pt is coughing HR up to 120s-130s. Provided teaching on incentive spirometer and deep breathing. Patient demonstrated well and understands purpose. Patient denies any pain.

## 2018-08-29 NOTE — Progress Notes (Signed)
Remains comfortable on [place] O2  Vitals:   08/29/18 1000 08/29/18 1100 08/29/18 1200 08/29/18 1300  BP:   127/83   Pulse: 94 95 (!) 106 (!) 104  Resp: (!) 29 12 17  (!) 22  Temp:   97.6 F (36.4 C)   TempSrc:   Axillary   SpO2: 95% 95% 94% 94%  Weight:      Height:      4 LPM   Gen: NAD HEENT: NCAT, sclerae white Neck: No JVD Lungs: breath sounds full, no wheezes or other adventitious sounds Cardiovascular: RRR, no murmurs Abdomen: Soft, nontender, normal BS Ext: without clubbing, cyanosis, edema Neuro: grossly intact Skin: Limited exam, no lesions noted   BMP Latest Ref Rng & Units 08/28/2018 08/27/2018 08/27/2018  Glucose 70 - 99 mg/dL 96 748(O) 707(E)  BUN 6 - 20 mg/dL 10 9 7   Creatinine 0.61 - 1.24 mg/dL 6.75 4.49 2.01  Sodium 135 - 145 mmol/L 138 137 136  Potassium 3.5 - 5.1 mmol/L 3.7 3.7 3.7  Chloride 98 - 111 mmol/L 106 105 106  CO2 22 - 32 mmol/L 21(L) 23 20(L)  Calcium 8.9 - 10.3 mg/dL 8.2(L) 8.1(L) 7.9(L)   CBC Latest Ref Rng & Units 08/29/2018 08/28/2018 08/27/2018  WBC 4.0 - 10.5 K/uL 8.0 11.2(H) 14.2(H)  Hemoglobin 13.0 - 17.0 g/dL 00.7 12.1 97.5  Hematocrit 39.0 - 52.0 % 40.0 39.1 40.1  Platelets 150 - 400 K/uL 228 228 197   PCT < 0.10  CXR: No new film  IMPRESSION: Acute hypoxemic respiratory failure Acute pneumonitis, likely COVID-19  PLAN/REC: Continue supplemental oxygen to maintain SPO2 greater than 90% Transfer to MedSurg floor ordered Complete course of hydroxychloroquine/azithromycin/zinc Continue colchicine.  Would continue for a total of 21 days from onset of illness Discontinue ceftriaxone Does not require any further Tocilizumab  Billy Fischer, MD PCCM service Mobile [phone] Pager [phone] 08/29/2018 2:17 PM

## 2018-08-29 NOTE — Progress Notes (Signed)
Patient ambulated to BR HR in 150s. O2 dropped 86%. Patient back in bed HR in 110s. O2 Sat not recovering. Still in 89s 90s. O2 now on 5 L. Sats 93%.

## 2018-08-29 NOTE — Progress Notes (Signed)
[facility] [place] at [facility]   PATIENT NAME: Anthony Pugh    MR#:  [id number]  DATE OF BIRTH:  1995/05/27  SUBJECTIVE:  CHIEF COMPLAINT:   Chief Complaint  Patient presents with   Fever   Cough  improving, waiting for floor bed REVIEW OF SYSTEMS:  Review of Systems  Constitutional: Negative for chills, fever and malaise/fatigue.  HENT: Negative for congestion, ear discharge, hearing loss and nosebleeds.   Eyes: Negative for blurred vision and double vision.  Respiratory: Positive for cough and shortness of breath. Negative for wheezing.   Cardiovascular: Positive for chest pain. Negative for palpitations and leg swelling.  Gastrointestinal: Positive for diarrhea. Negative for abdominal pain, constipation, nausea and vomiting.  Genitourinary: Negative for dysuria.  Musculoskeletal: Negative for myalgias.  Neurological: Negative for dizziness, focal weakness, seizures and headaches.  Psychiatric/Behavioral: Negative for depression.   DRUG ALLERGIES:  Not on File  VITALS:  Blood pressure 127/83, pulse (!) 104, temperature 97.6 F (36.4 C), temperature source Axillary, resp. rate (!) 22, height 5\' 10"  (1.778 m), weight 68 kg, SpO2 94 %. PHYSICAL EXAMINATION:  Physical Exam   GENERAL:  23 y.o.-year-old patient lying in the bed with no acute distress.  EYES: Pupils equal, round, reactive to light and accommodation. No scleral icterus. Extraocular muscles intact.  HEENT: Head atraumatic, normocephalic. Oropharynx and nasopharynx clear.  NECK:  Supple, no jugular venous distention. No thyroid enlargement, no tenderness.  LUNGS: moving air bilaterally, left basilar crackles and rhonchi, no wheezing,   or crepitation. No use of accessory muscles of respiration.  Diminished right basilar breath sounds CARDIOVASCULAR: S1, S2 rapid and regular. No murmurs, rubs, or gallops.  ABDOMEN: Soft, nontender, nondistended. Bowel sounds present. No  organomegaly or mass.  EXTREMITIES: No pedal edema, cyanosis, or clubbing.  NEUROLOGIC: Cranial nerves II through XII are intact. Muscle strength 5/5 in all extremities. Sensation intact. Gait not checked.  PSYCHIATRIC: The patient is alert and oriented x 3.  SKIN: No obvious rash, lesion, or ulcer.  LABORATORY PANEL:   CBC Recent Labs  Lab 08/29/18 0431  WBC 8.0  HGB 14.1  HCT 40.0  PLT 228   ------------------------------------------------------------------------------------------------------------------  Chemistries  Recent Labs  Lab 08/27/18 0629  08/28/18 0541 08/29/18 0431  NA  --    < > 138  --   K  --    < > 3.7  --   CL  --    < > 106  --   CO2  --    < > 21*  --   GLUCOSE  --    < > 96  --   BUN  --    < > 10  --   CREATININE  --    < > 0.61  --   CALCIUM  --    < > 8.2*  --   MG 2.1  --   --   --   AST 13*   < > 12* 15  ALT 10   < > 10 13  ALKPHOS 43   < > 40 40  BILITOT 1.1   < > 0.8 0.5   < > = values in this interval not displayed.   ------------------------------------------------------------------------------------------------------------------  Cardiac Enzymes No results for input(s): TROPONINI in the last 168 hours. ------------------------------------------------------------------------------------------------------------------  RADIOLOGY:  Ct Chest Wo Contrast  Result Date: 08/27/2018 CLINICAL DATA:  Cough and chest pain. Pneumonia. Multifocal opacities on radiograph. EXAM: CT CHEST WITHOUT CONTRAST TECHNIQUE:  Multidetector CT imaging of the chest was performed following the standard protocol without IV contrast. COMPARISON:  Radiograph earlier this day. FINDINGS: Cardiovascular: Normal caliber thoracic aorta. Heart is normal in size. No pericardial effusion. Mediastinum/Nodes: Mild multifocal mediastinal adenopathy. Right lower paratracheal node measures 11 mm short axis. Additional prominent mediastinal nodes. Lack of IV contrast limits assessment  for hilar adenopathy. Triangular soft tissue density in the anterior mediastinum consistent with residual thymus, not unexpected for age. Lungs/Pleura: Multifocal ground-glass and consolidative opacities throughout all lobes of both lungs. Mild smooth septal thickening. There are small bilateral pleural effusions. Trachea and mainstem bronchi are patent. Upper Abdomen: No acute abnormality. Musculoskeletal: There are no acute or suspicious osseous abnormalities. IMPRESSION: Multifocal consolidative and ground-glass opacities throughout all lobes of both lungs. These findings are most consistent with pneumonia including atypical infection and viral pneumonia. However, the presence of bilateral pleural effusions and mediastinal adenopathy are atypical or uncommonly reported for COVID-19. Alternative diagnosis should be considered. Electronically Signed   By: Narda RutherfordMelanie  Sanford M.D.   On: 08/27/2018 20:11   Dg Chest Port 1 View  Result Date: 08/28/2018 CLINICAL DATA:  Followup pneumonia EXAM: PORTABLE CHEST 1 VIEW COMPARISON:  08/26/2018 FINDINGS: Widespread bilateral patchy pulmonary infiltrates persist, with pleural effusions right larger than left. There is slight radiographic worsening of these findings. No qualitatively new finding. IMPRESSION: Slight radiographic worsening of widespread bilateral patchy pulmonary infiltrates and pleural effusions. Findings could be secondary to bacterial or viral pneumonia. Electronically Signed   By: Paulina FusiMark  Shogry M.D.   On: 08/28/2018 05:32    EKG:   Orders placed or performed during the hospital encounter of 08/26/18   ED EKG 12-Lead   ED EKG 12-Lead    ASSESSMENT AND PLAN:  23 year old no significant past medical history presents to hospital secondary to chest pain, shortness of breath and fever.  1.  Acute hypoxic respiratory failure-multilobar pneumonia - or likely COVID-19 infection -On high risk precautions -COVID-19 test is pending. -Appreciate ID  consult. -Patient currently on 4 L supplemental oxygen - wean as tolerated.  Cough meds prn -On antibiotics with Rocephin + zithromax -continue hydroxychloroquine.  Colchicine has been added to prevent cytokine storm. -Also received a dose of Actemra y'day -HIV test is negative.  Flu and respiratory panel is negative.   2.  Leukocytosis-secondary to pneumonia, improving  3. Sinus Tach: likely due to 1. Monitor on tele  4.  Hypokalemia-replaced  5.  DVT prophylaxis-Lovenox   All the records are reviewed and case discussed with Care Management/Social Workerr. Management plans discussed with the patient, Intensivist and they are in agreement.  CODE STATUS: Full Code  TOTAL TIME TAKING CARE OF THIS PATIENT: 15 minutes.   POSSIBLE D/C IN 1 DAYS, DEPENDING ON CLINICAL CONDITION.   Delfino LovettVipul Mareon Robinette M.D on 08/29/2018 at 2:08 PM  Between 7am to 6pm - Pager - [phone]  After 6pm go to [email or web address] - password [email or web address]  [facility] [place] [facility]  Office  [phone]  CC: Primary care physician; Patient, No Pcp Per

## 2018-08-29 NOTE — Progress Notes (Signed)
Patient continues to be on 4L , tachypneic and with non-productive dry cough. Patient encouraged to use incentive spirometer and to lay in prone position; patient agreeable and remained in prone position for most of the night. Patient reminded to intermittently prone throughout the day as well. Patients father was updated by phone at the beginning of shift regarding patient status. Patient is in no distress, BP, HR, and O2 SATs are within normal limits, adequate urine output and patient remained afebrile this shift. Will continue to monitor.

## 2018-08-29 NOTE — Progress Notes (Signed)
Patient, via spanish interpreter, admits to starting smoking about "20 days" ago. Patient admits to smoking cigarettes only; about 5 cigs per day. Denies vaping, e-cigs, or MJ use.

## 2018-08-29 NOTE — Progress Notes (Addendum)
1350 hrs: Attempted to call report to 2A X 1; awaiting call back.   1445 hrs: Report given to 2A RN.

## 2018-08-29 NOTE — Plan of Care (Signed)
  Problem: Education: Goal: Knowledge of General Education information will improve Description: Including pain rating scale, medication(s)/side effects and non-pharmacologic comfort measures Outcome: Progressing   Problem: Health Behavior/Discharge Planning: Goal: Ability to manage health-related needs will improve Outcome: Progressing   Problem: Clinical Measurements: Goal: Ability to maintain clinical measurements within normal limits will improve Outcome: Progressing Goal: Will remain free from infection Outcome: Progressing Goal: Diagnostic test results will improve Outcome: Progressing Goal: Respiratory complications will improve Outcome: Progressing Goal: Cardiovascular complication will be avoided Outcome: Progressing   Problem: Activity: Goal: Risk for activity intolerance will decrease Outcome: Progressing   Problem: Nutrition: Goal: Adequate nutrition will be maintained Outcome: Progressing   Problem: Coping: Goal: Level of anxiety will decrease Outcome: Progressing   Problem: Elimination: Goal: Will not experience complications related to bowel motility Outcome: Progressing Goal: Will not experience complications related to urinary retention Outcome: Progressing   Problem: Pain Managment: Goal: General experience of comfort will improve Outcome: Progressing   Problem: Safety: Goal: Ability to remain free from injury will improve Outcome: Progressing   Problem: Skin Integrity: Goal: Risk for impaired skin integrity will decrease Outcome: Progressing   Problem: Fluid Volume: Goal: Hemodynamic stability will improve Outcome: Progressing   Problem: Clinical Measurements: Goal: Diagnostic test results will improve Outcome: Progressing Goal: Signs and symptoms of infection will decrease Outcome: Progressing   Problem: Respiratory: Goal: Ability to maintain adequate ventilation will improve Outcome: Progressing   Problem: Activity: Goal: Ability  to tolerate increased activity will improve Outcome: Progressing   Problem: Clinical Measurements: Goal: Ability to maintain a body temperature in the normal range will improve Outcome: Progressing   Problem: Respiratory: Goal: Ability to maintain adequate ventilation will improve Outcome: Progressing Goal: Ability to maintain a clear airway will improve Outcome: Progressing   

## 2018-08-30 LAB — NOVEL CORONAVIRUS, NAA (HOSP ORDER, SEND-OUT TO REF LAB; TAT 18-24 HRS): SARS-CoV-2, NAA: NOT DETECTED

## 2018-08-30 NOTE — Progress Notes (Signed)
ID Continue Airborne/contact precautions even if novel Corona virus is resulted negative ( may be false negative). We do not have an alternate diagnosis to explain his condition currently

## 2018-08-30 NOTE — Progress Notes (Signed)
Patient s father call for update about patient condition, an interpreter was use  for update and father was told that we have to repeat covid 19 test per MD order .

## 2018-08-30 NOTE — Progress Notes (Addendum)
[facility] - [place] at [facility]   PATIENT NAME: Anthony Pugh    MR#:  [id number]  DATE OF BIRTH:  04/30/1996  SUBJECTIVE:  CHIEF COMPLAINT:   Chief Complaint  Patient presents with   Fever   Cough  Pleasant gentleman resting in no acute distress.  Patient verbalized through help of interpreter that he is feeling much better today.  Still with mild chest discomfort and dry cough. Reports shortness of breath with exertion.Had an episode of sinus tachycardia yesterday while ambulating to the bathroom.  No new episodes reported.  REVIEW OF SYSTEMS:  Review of Systems  Constitutional: Positive for malaise/fatigue. Negative for chills, fever and weight loss.  HENT: Positive for sore throat. Negative for congestion and hearing loss.   Eyes: Negative for blurred vision and double vision.  Respiratory: Positive for cough and shortness of breath. Negative for wheezing.   Cardiovascular: Positive for chest pain. Negative for palpitations, orthopnea and leg swelling.  Gastrointestinal: Positive for diarrhea. Negative for abdominal pain, nausea and vomiting.  Genitourinary: Negative for dysuria and urgency.  Musculoskeletal: Negative for myalgias.  Skin: Negative for rash.  Neurological: Negative for dizziness, sensory change, speech change, focal weakness and headaches.  Psychiatric/Behavioral: Negative for depression.   DRUG ALLERGIES:  Not on File  VITALS:  Blood pressure 128/83, pulse 97, temperature 97.8 F (36.6 C), resp. rate 20, height 5\' 10"  (1.778 m), weight 68 kg, SpO2 96 %.  PHYSICAL EXAMINATION:   GENERAL:  23 y.o.-year-old patient lying in the bed with no acute distress.  EYES: Pupils equal, round, reactive to light and accommodation. No scleral icterus. Extraocular muscles intact.  HEENT: Head atraumatic, normocephalic. Oropharynx and nasopharynx clear.  NECK:  Supple, no jugular venous distention. No thyroid enlargement, no  tenderness.  LUNGS: Normal breath sounds bilaterally, no wheezing, rales,rhonchi or crepitation. No use of accessory muscles of respiration.  CARDIOVASCULAR: S1, S2 normal. No murmurs, rubs, or gallops.  ABDOMEN: Soft, nontender, nondistended. Bowel sounds present. No organomegaly or mass.  EXTREMITIES: No pedal edema, cyanosis, or clubbing.  NEUROLOGIC: Cranial nerves II through XII are intact. Muscle strength 5/5 in all extremities. Sensation intact. Gait not checked.  PSYCHIATRIC: The patient is alert and oriented x 3.  SKIN: No obvious rash, lesion, or ulcer.   LABORATORY PANEL:   CBC Recent Labs  Lab 08/29/18 0431  WBC 8.0  HGB 14.1  HCT 40.0  PLT 228   ------------------------------------------------------------------------------------------------------------------  Chemistries  Recent Labs  Lab 08/27/18 0629  08/28/18 0541 08/29/18 0431  NA  --    < > 138  --   K  --    < > 3.7  --   CL  --    < > 106  --   CO2  --    < > 21*  --   GLUCOSE  --    < > 96  --   BUN  --    < > 10  --   CREATININE  --    < > 0.61  --   CALCIUM  --    < > 8.2*  --   MG 2.1  --   --   --   AST 13*   < > 12* 15  ALT 10   < > 10 13  ALKPHOS 43   < > 40 40  BILITOT 1.1   < > 0.8 0.5   < > = values in this interval not displayed.   ------------------------------------------------------------------------------------------------------------------  Cardiac Enzymes No results for input(s): TROPONINI in the last 168 hours. ------------------------------------------------------------------------------------------------------------------  RADIOLOGY:  No results found.  EKG:   Orders placed or performed during the hospital encounter of 08/26/18   ED EKG 12-Lead   ED EKG 12-Lead    ASSESSMENT AND PLAN:   23 year old [place] gentleman presenting  with complaints of difficulty breathing, chest tightness, generalized malaise and fever.  1. Acute hypoxic respiratory failure- initial  concerns for potential COVID 19 however he has tested negative.Multilobar pneumonia remains in the differentials -CT chest shows multifocal consolidative and ground glass opacities bilaterally consistent with pneumonitis with worsening findings on repeat chest x-ray - insidious infectious workup , including HIV and Influenza neg. -On azithromycin.  Vancomycin, cefepime and ceftriaxone discontinued -Completed hydroxychloroquine and Tocilizumab, remains on colchicine for a total of 21 days from onset of illness -Weaned off supplemental oxygen at this time -Per ID he will remain on airborne/contact precaution even if coronavirus test is negative(may be false negative) -ID consult appreciated  2.  Acute febrile illness with pneumonitis: Improving -WBC improved -Procalcitonin <0.10 -Continue antibiotics as above  3.  Sinus tachycardia-likely due to acute hypoxic respiratory failure -Improved and now back to NSR -Continue telemetry monitoring  4.  DVT prophylaxis-Lovenox  All the records are reviewed and case discussed with Care Management/Social Workerr. Management plans discussed with the patient, family and they are in agreement.  Due to language barrier, an interpreter was present during the history-taking and subsequent discussion (and for part of the physical exam) with this patient.  CODE STATUS: FULL  TOTAL TIME TAKING CARE OF THIS PATIENT: 38 minutes.   POSSIBLE D/C IN 1-2 DAYS, DEPENDING ON CLINICAL CONDITION.  This patient was staffed with Dr. Sarina Ser, Sherryll Burger who personally evaluated patient, reviewed documentation and agreed with assessment and plan of care as above.  Webb Silversmith, DNP, FNP-BC Hospitalist Nurse Practitioner   08/30/2018 at 12:46 PM  Between 7am to 6pm - Pager - [phone]  After 6pm go to [email or web address] - password [employer]  [facility] [place] [facility]  Office  [phone]  CC: Primary care physician; Patient, No Pcp Per

## 2018-08-30 NOTE — Progress Notes (Signed)
Patient resting in the bed at this time, denies any pain, spanish interpreter [phone] was used during patient encounter .

## 2018-08-31 ENCOUNTER — Inpatient Hospital Stay: Payer: Self-pay

## 2018-08-31 LAB — CBC WITH DIFFERENTIAL/PLATELET
Abs Immature Granulocytes: 0.97 10*3/uL — ABNORMAL HIGH (ref 0.00–0.07)
Basophils Absolute: 0 10*3/uL (ref 0.0–0.1)
Basophils Relative: 0 %
Eosinophils Absolute: 2.6 10*3/uL — ABNORMAL HIGH (ref 0.0–0.5)
Eosinophils Relative: 23 %
HCT: 44 % (ref 39.0–52.0)
Hemoglobin: 15.5 g/dL (ref 13.0–17.0)
Immature Granulocytes: 9 %
Lymphocytes Relative: 13 %
Lymphs Abs: 1.4 10*3/uL (ref 0.7–4.0)
MCH: 30.8 pg (ref 26.0–34.0)
MCHC: 35.2 g/dL (ref 30.0–36.0)
MCV: 87.3 fL (ref 80.0–100.0)
Monocytes Absolute: 0.9 10*3/uL (ref 0.1–1.0)
Monocytes Relative: 8 %
Neutro Abs: 5.5 10*3/uL (ref 1.7–7.7)
Neutrophils Relative %: 47 %
Platelets: 305 10*3/uL (ref 150–400)
RBC: 5.04 MIL/uL (ref 4.22–5.81)
RDW: 12 % (ref 11.5–15.5)
Smear Review: NORMAL
WBC: 11.4 10*3/uL — ABNORMAL HIGH (ref 4.0–10.5)
nRBC: 0 % (ref 0.0–0.2)

## 2018-08-31 LAB — CULTURE, BLOOD (ROUTINE X 2)
Culture: NO GROWTH
Culture: NO GROWTH
Special Requests: ADEQUATE

## 2018-08-31 LAB — COMPREHENSIVE METABOLIC PANEL
ALT: 46 U/L — ABNORMAL HIGH (ref 0–44)
AST: 40 U/L (ref 15–41)
Albumin: 3.5 g/dL (ref 3.5–5.0)
Alkaline Phosphatase: 47 U/L (ref 38–126)
Anion gap: 11 (ref 5–15)
BUN: 14 mg/dL (ref 6–20)
CO2: 23 mmol/L (ref 22–32)
Calcium: 8.7 mg/dL — ABNORMAL LOW (ref 8.9–10.3)
Chloride: 102 mmol/L (ref 98–111)
Creatinine, Ser: 0.72 mg/dL (ref 0.61–1.24)
GFR calc Af Amer: >60 mL/min (ref >60)
GFR calc non Af Amer: >60 mL/min (ref >60)
Glucose, Bld: 146 mg/dL — ABNORMAL HIGH (ref 70–99)
Potassium: 3.7 mmol/L (ref 3.5–5.1)
Sodium: 136 mmol/L (ref 135–145)
Total Bilirubin: 0.3 mg/dL (ref 0.3–1.2)
Total Protein: 6.8 g/dL (ref 6.5–8.1)

## 2018-08-31 LAB — PROCALCITONIN: Procalcitonin: <0.1 ng/mL

## 2018-08-31 LAB — FERRITIN: Ferritin: 543 ng/mL — ABNORMAL HIGH (ref 24–336)

## 2018-08-31 LAB — SARS CORONAVIRUS 2 BY RT PCR (HOSPITAL ORDER, PERFORMED IN ~~LOC~~ HOSPITAL LAB): SARS Coronavirus 2: NEGATIVE

## 2018-08-31 MED ORDER — COLCHICINE 0.6 MG PO TABS: 0.6000 mg | ORAL_TABLET | Freq: Every day | ORAL | Status: DC

## 2018-08-31 NOTE — Progress Notes (Signed)
   Date of Admission:  08/26/2018       Subjective: Feeling better No fever Some cough Still some sob  Medications:  . chlorpheniramine-HYDROcodone  5 mL Oral BID  . colchicine  0.6 mg Oral BID  . enoxaparin (LOVENOX) injection  40 mg Subcutaneous Q24H    Objective: Vital signs in last 24 hours: Temp:  [97.9 F (36.6 C)-98.9 F (37.2 C)] 98.9 F (37.2 C) (04/13 0814) Pulse Rate:  [90-104] 94 (04/13 0814) Resp:  [16-18] 18 (04/13 0814) BP: (102-135)/(66-91) 111/66 (04/13 0814) SpO2:  [97 %-98 %] 98 % (04/13 0814)  PHYSICAL EXAM:  General: Alert, cooperative, no distress at rest , appears stated age.  Lungs did not listen due to PPE but CXR reviewed Heart: Tachycardia Abdomen: Soft,  Extremities: atraumatic, no cyanosis. No edema. No clubbing Skin: No rashes or lesions. Or bruising Lymph: Cervical, supraclavicular normal. Neurologic: Grossly non-focal  Lab Results Recent Labs    08/29/18 0431  WBC 8.0  HGB 14.1  HCT 40.0   Liver Panel Recent Labs    08/29/18 0431  PROT 6.5  ALBUMIN 2.9*  AST 15  ALT 13  ALKPHOS 40  BILITOT 0.5  BILIDIR <0.1  IBILI NOT CALCULATED   Sedimentation Rate No results for input(s): ESRSEDRATE in the last 72 hours. C-Reactive Protein Recent Labs    08/29/18 0431  CRP 10.9*   08/26/18   08/27/18   08/28/18    08/31/18  Microbiology:  MRSA nares neg BC NEg    Assessment/Plan: Pt presented with 3 day h/o cough, fever and sob B/l infiltrates in the lungs Was being treated as COVID 19 -got 5 days of hydroxychloroquine and one dose of  Actemra as per intensivist- was also on Colchicine He was getting ceftriaxone X 3 days ( DC 4/10)  And currently on  zithromax ( day 4) a SARS COV 2 came back negative on 4/11 and it was repeated on 4/12 and was negative as well. His picture still  fits with sars COV2 illness  except for 2 factors -no one else in his family is sick  and  pleural effusion which is unusual in COVID 25  He is improving clinically as well as radiologically -but still has tachycardia and pulse ox drops to low 80s on ambulating. Procalcitonin is low .  D.D Other viral pneumonia ( resp viral panel is negative)  atypical pneumonia like mycoplasma or legionella Other organisms like tularemia, lepto, unlikely There is minimal eosinophilia noted past 2 days- picture is not strongyloides hyperinfection syndrome, it is also not tropical eosinophilia HIV negative Will get 2 d echo to look for pericardial effusion/myocarditis  Will continue to keep him in airborne/contact

## 2018-09-01 ENCOUNTER — Inpatient Hospital Stay
Admit: 2018-09-01 | Discharge: 2018-09-01 | Disposition: A | Discharge status: Home / Self Care | Payer: Self-pay | Attending: Infectious Diseases | Admitting: Infectious Diseases

## 2018-09-01 LAB — MYCOPLASMA PNEUMONIAE ANTIBODY, IGM: Mycoplasma pneumo IgM: <770 U/mL (ref 0–769)

## 2018-09-01 LAB — ECHOCARDIOGRAM COMPLETE
Height: 70 in
Weight: 2400 oz

## 2018-09-01 LAB — STRONGYLOIDES, AB, IGG: Strongyloides, Ab, IgG: NEGATIVE

## 2018-09-01 LAB — C-REACTIVE PROTEIN: CRP: 1.4 mg/dL — ABNORMAL HIGH (ref <1.0)

## 2018-09-01 MED ORDER — GUAIFENESIN 100 MG/5ML PO SOLN: 5.0000 mL | ORAL | 0 refills | Status: AC | PRN

## 2018-09-01 MED ORDER — AZITHROMYCIN 250 MG PO TABS: 500.0000 mg | ORAL_TABLET | Freq: Once | ORAL | Status: DC

## 2018-09-01 MED ORDER — AZITHROMYCIN 250 MG PO TABS: 500.0000 mg | ORAL_TABLET | Freq: Every day | ORAL | 0 refills | Status: AC

## 2018-09-01 NOTE — Progress Notes (Signed)
Pt placed back on isolation per ID RN called and instructed nurse to resume per MD note. I will continue to assess.

## 2018-09-01 NOTE — Progress Notes (Signed)
   Date of Admission:  08/26/2018    ID: Anthony Pugh is a 23 y.o. male Active Problems:   Multifocal pneumonia  Spoke to the patient on the phone  Subjective: Doing better Says he can walk in the room and not feel sob Still has some cough  Medications:  . chlorpheniramine-HYDROcodone  5 mL Oral BID  . enoxaparin (LOVENOX) injection  40 mg Subcutaneous Q24H    Objective: Vital signs in last 24 hours: Temp:  [98.2 F (36.8 C)-98.3 F (36.8 C)] 98.3 F (36.8 C) (04/14 0946) Pulse Rate:  [87-112] 98 (04/14 0946) Resp:  [18] 18 (04/14 0347) BP: (110-114)/(69-77) 110/69 (04/14 0946) SpO2:  [95 %-97 %] 95 % (04/14 0946)  PHYSICAL EXAM:   Improving vitals- No fever X 5 days RR normalized Pulse ox better than before   Lab Results Recent Labs    08/31/18 1252  WBC 11.4*  HGB 15.5  HCT 44.0  NA 136  K 3.7  CL 102  CO2 23  BUN 14  CREATININE 0.72   Liver Panel Recent Labs    08/31/18 1252  PROT 6.8  ALBUMIN 3.5  AST 40  ALT 46*  ALKPHOS 47  BILITOT 0.3    C-reactive protein Recent Labs    08/31/18 1252  CRP 1.4*  was  Microbiology:  Studies/Results: 08/26/18   08/27/18   08/28/18    08/31/18  Microbiology:  MRSA nares neg BC NEg    Assessment/Plan: Pt presented with 3 day h/o cough, fever and sob B/l infiltrates in the lungs Was being treated as COVID 19 -got 5 days of hydroxychloroquine and one dose of  Actemra as per intensivist- was also on Colchicine He got ceftriaxone X 3 days ( DC 4/10)  And currently on  zithromax ( day 5)  SARS COV 2 came back negative on 4/11 and it was repeated on 4/12 and was negative as well. His picture still  fits with sars COV2 illness  except for 2 factors -no one else in his family is sick  and  pleural effusion which is unusual in COVID 34 He is improving clinically as well as radiologically -today he is not tachycardic and pulse ox id 95% eben on ambulation without oxygen D.D  Other viral pneumonia ( resp viral panel is negative)  atypical pneumonia like mycoplasma or legionella Other organisms like tularemia, lepto, unlikely There is minimal eosinophilia noted past 2 days-strongyloides ab neg HIV negative  2 d echo done- good EF, no pericardial effusion  Pt is getting discharged today. He will wear a mask in front of family members until his cough clears It will be interesting to see his serology- Once available may do IgG level  Discussed with his nurse and intensivist

## 2018-09-01 NOTE — Progress Notes (Signed)
Pt will be discharged. Interpreter on a stick used for discharge instructions. Pt verbalized understanding. I will continue to assess.

## 2018-09-01 NOTE — Progress Notes (Signed)
SATURATION QUALIFICATIONS: (This note is used to comply with regulatory documentation for home oxygen)  Patient Saturations on Room Air at Rest = 100%  Patient Saturations on Room Air while Ambulating = 95%  Patient Saturations on 0 Liters of oxygen while Ambulating = n/a%  Please briefly explain why patient needs home oxygen: 

## 2018-09-01 NOTE — Discharge Summary (Signed)
[facility] [place] at [facility]   PATIENT NAME: Anthony Pugh    MR#:  [id number]  DATE OF BIRTH:  11/21/1995  DATE OF ADMISSION:  08/26/2018 ADMITTING PHYSICIAN: Auburn BilberryShreyang Patel, MD  DATE OF DISCHARGE: 09/01/18  PRIMARY CARE PHYSICIAN: Patient, No Pcp Per    ADMISSION DIAGNOSIS:  Hypoxia [R09.02] Multifocal pneumonia [J18.9]  DISCHARGE DIAGNOSIS:  Active Problems:   Multifocal pneumonia   SECONDARY DIAGNOSIS:  History reviewed. No pertinent past medical history.  HOSPITAL COURSE:   23 year old [place] gentleman presenting  with complaints of difficulty breathing, chest tightness, generalized malaise and fever.  1. Acute hypoxic respiratory failure- initial concerns for potential COVID 19 however he has tested negative.Multilobar pneumonia remains in the differentials -CT chest shows multifocal consolidative and ground glass opacities bilaterally consistent with pneumonitis with worsening findings on repeat chest x-ray - insidious infectious workup , including HIV and Influenza neg. -On azithromycin.  Vancomycin, cefepime and ceftriaxone discontinued -Completed hydroxychloroquine and Tocilizumab,  -Weaned off supplemental oxygen at this time -Per ID he will remain on airborne/contact precaution even if coronavirus test is negative(may be false negative)- repeat test also negative for COVID 19, ID suggesting to check for fungal inf and Cardiac origin. -ID consult appreciated - Pt improved and was on room air and ambulate well.  2.  Acute febrile illness with pneumonitis: Improving -WBC improved -Procalcitonin <0.10 -Continue antibiotics as above  3.  Sinus tachycardia-likely due to acute hypoxic respiratory failure -Improved and now back to NSR -Continue telemetry monitoring- checked ECho. No major abnormalities.  4.  DVT prophylaxis-Lovenox  DISCHARGE CONDITIONS:   Stable.  CONSULTS OBTAINED:    DRUG ALLERGIES:  Not  on File  DISCHARGE MEDICATIONS:   Allergies as of 09/01/2018   Not on File     Medication List    TAKE these medications   azithromycin 250 MG tablet Commonly known as:  ZITHROMAX Take 2 tablets (500 mg total) by mouth daily for 2 days.   guaiFENesin 100 MG/5ML Soln Commonly known as:  ROBITUSSIN Take 5 mLs (100 mg total) by mouth every 4 (four) hours as needed for cough or to loosen phlegm.        DISCHARGE INSTRUCTIONS:    Follow with PMD in 1-2 weeks.  If you experience worsening of your admission symptoms, develop shortness of breath, life threatening emergency, suicidal or homicidal thoughts you must seek medical attention immediately by calling 911 or calling your MD immediately  if symptoms less severe.  You Must read complete instructions/literature along with all the possible adverse reactions/side effects for all the Medicines you take and that have been prescribed to you. Take any new Medicines after you have completely understood and accept all the possible adverse reactions/side effects.   Please note  You were cared for by a hospitalist during your hospital stay. If you have any questions about your discharge medications or the care you received while you were in the hospital after you are discharged, you can call the unit and asked to speak with the hospitalist on call if the hospitalist that took care of you is not available. Once you are discharged, your primary care physician will handle any further medical issues. Please note that NO REFILLS for any discharge medications will be authorized once you are discharged, as it is imperative that you return to your primary care physician (or establish a relationship with a primary care physician if you do not have one) for your aftercare needs  so that they can reassess your need for medications and monitor your lab values.    Today   CHIEF COMPLAINT:   Chief Complaint  Patient presents with  . Fever  . Cough     HISTORY OF PRESENT ILLNESS:  Anthony Pugh  is a 23 y.o. male with no significant medical history presents to the emergency room with shortness of breath cough and fever.  Patient since last night started coughing and becoming short of breath and fever that got progressively worse.  Patient went to the clinic and was found to be hypoxic with oxygen in the 80s.  Patient initially required 10 L nonrebreather but now is on less oxygen.  Continues to have dry cough in the emergency room.   VITAL SIGNS:  Blood pressure 110/69, pulse 98, temperature 98.3 F (36.8 C), resp. rate 18, height  (1.778 m), weight 68 kg, SpO2 95 %.  I/O:    Intake/Output Summary (Last 24 hours) at 09/01/2018 1552 Last data filed at 09/01/2018 1226 Gross per 24 hour  Intake -  Output 350 ml  Net -350 ml    PHYSICAL EXAMINATION:  GENERAL:  22 y.o.-year-old patient lying in the bed with no acute distress.  EYES: Pupils equal, round, reactive to light and accommodation. No scleral icterus. Extraocular muscles intact.  HEENT: Head atraumatic, normocephalic. Oropharynx and nasopharynx clear.  NECK:  Supple, no jugular venous distention. No thyroid enlargement, no tenderness.  LUNGS: Normal breath sounds bilaterally, no wheezing, rales,rhonchi or crepitation. No use of accessory muscles of respiration.  CARDIOVASCULAR: S1, S2 normal. No murmurs, rubs, or gallops.  ABDOMEN: Soft, non-tender, non-distended. Bowel sounds present. No organomegaly or mass.  EXTREMITIES: No pedal edema, cyanosis, or clubbing.  NEUROLOGIC: Cranial nerves II through XII are intact. Muscle strength 5/5 in all extremities. Sensation intact. Gait not checked.  PSYCHIATRIC: The patient is alert and oriented x 3.  SKIN: No obvious rash, lesion, or ulcer.   DATA REVIEW:   CBC Recent Labs  Lab 08/31/18 1252  WBC 11.4*  HGB 15.5  HCT 44.0  PLT 305    Chemistries  Recent Labs  Lab 08/27/18 0629  08/31/18 1252  NA   --    < > 136  K  --    < > 3.7  CL  --    < > 102  CO2  --    < > 23  GLUCOSE  --    < > 146*  BUN  --    < > 14  CREATININE  --    < > 0.72  CALCIUM  --    < > 8.7*  MG 2.1  --   --   AST 13*   < > 40  ALT 10   < > 46*  ALKPHOS 43   < > 47  BILITOT 1.1   < > 0.3   < > = values in this interval not displayed.    Cardiac Enzymes No results for input(s): TROPONINI in the last 168 hours.  Microbiology Results  Results for orders placed or performed during the hospital encounter of 08/26/18  Blood Culture (routine x 2)     Status: None   Collection Time: 08/26/18  4:34 PM  Result Value Ref Range Status   Specimen Description BLOOD RIGHT ANTECUBITAL  Final   Special Requests   Final    BOTTLES DRAWN AEROBIC AND ANAEROBIC Blood Culture adequate volume   Culture   Final  NO GROWTH 5 DAYS Performed at [facility], [address] [place], [place] [address]    Report Status 08/31/2018 FINAL  Final  Blood Culture (routine x 2)     Status: None   Collection Time: 08/26/18  4:34 PM  Result Value Ref Range Status   Specimen Description BLOOD BLOOD RIGHT ARM  Final   Special Requests   Final    BOTTLES DRAWN AEROBIC AND ANAEROBIC Blood Culture results may not be optimal due to an excessive volume of blood received in culture bottles   Culture   Final    NO GROWTH 5 DAYS Performed at [facility], [address] [place], [place] [address]    Report Status 08/31/2018 FINAL  Final  Novel Coronavirus, NAA (hospital order; send-out to ref lab)     Status: None   Collection Time: 08/26/18  4:34 PM  Result Value Ref Range Status   SARS-CoV-2, NAA NOT DETECTED NOT DETECTED Final    Comment: Negative (Not Detected) results do not exclude infection caused by SARS CoV 2 and should not be used as the sole basis for treatment or other patient management decisions. Optimum specimen types and timing for peak viral levels during infections caused  by SARS CoV 2 have not  been determined. Collection of multiple specimens (types and time points) from the same patient may be necessary to detect the virus. Improper specimen collection and handling, sequence variability underlying assay primers and or probes, or the presence of organisms in  quantities less than the limit of detection of the assay may lead to false negative results. Positive and negative predictive values of testing are highly dependent on prevalence. False negative results are more likely when prevalence of disease is high. (NOTE) The expected result is Negative (Not Detected). The SARS CoV 2 test is intended for the presumptive qualitative  detection of nucleic acid from SARS CoV 2 in upper and lower  respir atory specimens. Testing methodology is real time RT PCR. Test results must be correlated with clinical presentation and  evaluated in the context of other laboratory and epidemiologic data.  Test performance can be affected because the epidemiology and  clinical spectrum of infection caused by SARS CoV 2 is not fully  known. For example, the optimum types of specimens to collect and  when during the course of infection these specimens are most likely  to contain detectable viral RNA may not be known. This test has not been Food and Drug Administration (FDA) cleared or  approved and has been authorized by FDA under an Emergency Use  Authorization (EUA). The test is only authorized for the duration of  the declaration that circumstances exist justifying the authorization  of emergency use of in vitro diagnostic tests for detection and or  diagnosis of SARS CoV 2 under Section 564(b)(1) of the Act, 21 U.S.C.  section [phone] 3(b)(1), unless the authorization is terminated or   revoked sooner. [facility] is certified under the  Clinical Laboratory Improvement Amendments of 1988 (CLIA), 42 U.S.C.  section [phone], to perform high complexity tests. Performed at [facility] CLIA [id number] [address], [address], [address], [place], [place] [address] Laboratory Director: Turner Daniels, MD Fact Sheet for Healthcare Providers  https://pope.com/ Fact Sheet for Patients  BoilerBrush.com.cy Performed at [facility], [address] [place], [place] [address]    Coronavirus Source NASOPHARYNGEAL  Final    Comment: Performed at [facility], [address] [place], [place] [address]  Respiratory Panel by PCR     Status: None   Collection Time: 08/26/18  4:34 PM  Result Value Ref Range Status   Adenovirus NOT DETECTED NOT DETECTED Final   Coronavirus 229E NOT DETECTED NOT DETECTED Final    Comment: (NOTE) The Coronavirus on the Respiratory Panel, DOES NOT test for the novel  Coronavirus (2019 nCoV)    Coronavirus HKU1 NOT DETECTED NOT DETECTED Final   Coronavirus NL63 NOT DETECTED NOT DETECTED Final   Coronavirus OC43 NOT DETECTED NOT DETECTED Final   Metapneumovirus NOT DETECTED NOT DETECTED Final   Rhinovirus / Enterovirus NOT DETECTED NOT DETECTED Final   Influenza A NOT DETECTED NOT DETECTED Final   Influenza B NOT DETECTED NOT DETECTED Final   Parainfluenza Virus 1 NOT DETECTED NOT DETECTED Final   Parainfluenza Virus 2 NOT DETECTED NOT DETECTED Final   Parainfluenza Virus 3 NOT DETECTED NOT DETECTED Final   Parainfluenza Virus 4 NOT DETECTED NOT DETECTED Final   Respiratory Syncytial Virus NOT DETECTED NOT DETECTED Final   Bordetella pertussis NOT DETECTED NOT DETECTED Final   Chlamydophila pneumoniae NOT DETECTED NOT DETECTED Final   Mycoplasma pneumoniae NOT DETECTED NOT DETECTED Final    Comment: Performed at [facility], [address], [place], [place] [address]  C difficile quick scan w PCR reflex     Status: None   Collection Time: 08/27/18 12:08 PM  Result Value Ref Range Status   C Diff antigen NEGATIVE NEGATIVE Final   C Diff toxin NEGATIVE NEGATIVE  Final   C Diff interpretation No C. difficile detected.  Final    Comment: Performed at [facility], [address] [place], [place] [address]  MRSA PCR Screening     Status: None   Collection Time: 08/27/18  2:10 PM  Result Value Ref Range Status   MRSA by PCR NEGATIVE NEGATIVE Final    Comment:        The GeneXpert MRSA Assay (FDA approved for NASAL specimens only), is one component of a comprehensive MRSA colonization surveillance program. It is not intended to diagnose MRSA infection nor to guide or monitor treatment for MRSA infections. Performed at [facility], [address] [place], [place] [address]   SARS Coronavirus 2 [facility] order, Performed in [facility] hospital lab)     Status: None   Collection Time: 08/30/18  1:21 PM  Result Value Ref Range Status   SARS Coronavirus 2 NEGATIVE NEGATIVE Final    Comment: (NOTE) If result is NEGATIVE SARS-CoV-2 target nucleic acids are NOT DETECTED. The SARS-CoV-2 RNA is generally detectable in upper and lower  respiratory specimens during the acute phase of infection. The lowest  concentration of SARS-CoV-2 viral copies this assay can detect is 250  copies / mL. A negative result does not preclude SARS-CoV-2 infection  and should not be used as the sole basis for treatment or other  patient management decisions.  A negative result may occur with  improper specimen collection / handling, submission of specimen other  than nasopharyngeal swab, presence of viral mutation(s) within the  areas targeted by this assay, and inadequate number of viral copies  (<250 copies / mL). A negative result must be combined with clinical  observations, patient history, and epidemiological information. If result is POSITIVE SARS-CoV-2 target nucleic acids are DETECTED. The SARS-CoV-2 RNA is generally detectable in upper and lower  respiratory specimens dur ing the acute phase of  infection.  Positive  results are  indicative of active infection with SARS-CoV-2.  Clinical  correlation with patient history and other diagnostic information is  necessary to determine patient infection status.  Positive results do  not rule out bacterial infection or co-infection with other viruses. If result is PRESUMPTIVE POSTIVE SARS-CoV-2 nucleic acids MAY BE PRESENT.   A presumptive positive result was obtained on the submitted specimen  and confirmed on repeat testing.  While 2019 novel coronavirus  (SARS-CoV-2) nucleic acids may be present in the submitted sample  additional confirmatory testing may be necessary for epidemiological  and / or clinical management purposes  to differentiate between  SARS-CoV-2 and other Sarbecovirus currently known to infect humans.  If clinically indicated additional testing with an alternate test  methodology [phone]) is advised. The SARS-CoV-2 RNA is generally  detectable in upper and lower respiratory sp ecimens during the acute  phase of infection. The expected result is Negative. Fact Sheet for Patients:  BoilerBrush.com.cy Fact Sheet for Healthcare Providers: https://pope.com/ This test is not yet approved or cleared by the [place] FDA and has been authorized for detection and/or diagnosis of SARS-CoV-2 by FDA under an Emergency Use Authorization (EUA).  This EUA will remain in effect (meaning this test can be used) for the duration of the COVID-19 declaration under Section 564(b)(1) of the Act, 21 U.S.C. section 360bbb-3(b)(1), unless the authorization is terminated or revoked sooner. Performed at [facility], [address], [place], [place] [address]     RADIOLOGY:  Dg Chest Port 1 View  Result Date: 08/31/2018 CLINICAL DATA:  Follow-up pneumonia EXAM: PORTABLE CHEST 1 VIEW COMPARISON:  08/28/2018 chest radiograph and prior studies FINDINGS: Decreased bilateral airspace opacities noted. Trace bilateral  pleural effusions appear slightly decreased as well. No pneumothorax. Subsegmental atelectasis in the RIGHT LOWER lung noted. IMPRESSION: Decreased bilateral airspace opacities/pneumonia. Electronically Signed   By: Harmon Pier M.D.   On: 08/31/2018 13:02    EKG:   Orders placed or performed during the hospital encounter of 08/26/18  . ED EKG 12-Lead  . ED EKG 12-Lead      Management plans discussed with the patient, family and they are in agreement.  CODE STATUS:     Code Status Orders  (From admission, onward)         Start     Ordered   08/26/18 2035  Full code  Continuous     08/26/18 2034        Code Status History    This patient has a current code status but no historical code status.      TOTAL TIME TAKING CARE OF THIS PATIENT: 35 minutes.    Altamese Dilling M.D on 09/01/2018 at 3:52 PM  Between 7am to 6pm - Pager - [phone]  After 6pm go to [email or web address] - password [email or web address]  [facility]  Office  [phone]  CC: Primary care physician; Patient, No Pcp Per   Note: This dictation was prepared with Dragon dictation along with smaller phrase technology. Any transcriptional errors that result from this process are unintentional.

## 2018-09-01 NOTE — Discharge Instructions (Signed)
Advised to have face covering or mask until cough resolved.

## 2018-09-01 NOTE — Progress Notes (Signed)
*  PRELIMINARY RESULTS* Echocardiogram 2D Echocardiogram has been performed.  Anthony Pugh 09/01/2018, 8:36 AM

## 2018-09-01 NOTE — Progress Notes (Signed)
[facility] -  at [facility]   PATIENT NAME: Anthony Pugh    MR#:  [id number]  DATE OF BIRTH:  1996/04/24  SUBJECTIVE:  CHIEF COMPLAINT:   Chief Complaint  Patient presents with  . Fever  . Cough  Pleasant gentleman resting in no acute distress.  Patient verbalized through help of interpreter that he is feeling much better today.  Still with mild chest discomfort and dry cough. Reports shortness of breath with exertion.Had an episode of sinus tachycardia  while ambulating to the bathroom.    REVIEW OF SYSTEMS:  Review of Systems  Constitutional: Positive for malaise/fatigue. Negative for chills, fever and weight loss.  HENT: Positive for sore throat. Negative for congestion and hearing loss.   Eyes: Negative for blurred vision and double vision.  Respiratory: Positive for cough and shortness of breath. Negative for wheezing.   Cardiovascular: Positive for chest pain. Negative for palpitations, orthopnea and leg swelling.  Gastrointestinal: Positive for diarrhea. Negative for abdominal pain, nausea and vomiting.  Genitourinary: Negative for dysuria and urgency.  Musculoskeletal: Negative for myalgias.  Skin: Negative for rash.  Neurological: Negative for dizziness, sensory change, speech change, focal weakness and headaches.  Psychiatric/Behavioral: Negative for depression.   DRUG ALLERGIES:  Not on File  VITALS:  Blood pressure 112/70, pulse 87, temperature 98.3 F (36.8 C), temperature source Oral, resp. rate 18, height 5\' 10"  (1.778 m), weight 68 kg, SpO2 96 %.  PHYSICAL EXAMINATION:   GENERAL:  23 y.o.-year-old patient lying in the bed with no acute distress.  EYES: Pupils equal, round, reactive to light and accommodation. No scleral icterus. Extraocular muscles intact.  HEENT: Head atraumatic, normocephalic. Oropharynx and nasopharynx clear.  NECK:  Supple, no jugular venous distention. No thyroid enlargement, no tenderness.   LUNGS: Normal breath sounds bilaterally, no wheezing, rales,rhonchi or crepitation. No use of accessory muscles of respiration.  CARDIOVASCULAR: S1, S2 normal. No murmurs, rubs, or gallops.  ABDOMEN: Soft, nontender, nondistended. Bowel sounds present. No organomegaly or mass.  EXTREMITIES: No pedal edema, cyanosis, or clubbing.  NEUROLOGIC: Cranial nerves II through XII are intact. Muscle strength 5/5 in all extremities. Sensation intact. Gait not checked.  PSYCHIATRIC: The patient is alert and oriented x 3.  SKIN: No obvious rash, lesion, or ulcer.   LABORATORY PANEL:   CBC Recent Labs  Lab 08/31/18 1252  WBC 11.4*  HGB 15.5  HCT 44.0  PLT 305   ------------------------------------------------------------------------------------------------------------------  Chemistries  Recent Labs  Lab 08/27/18 0629  08/31/18 1252  NA  --    < > 136  K  --    < > 3.7  CL  --    < > 102  CO2  --    < > 23  GLUCOSE  --    < > 146*  BUN  --    < > 14  CREATININE  --    < > 0.72  CALCIUM  --    < > 8.7*  MG 2.1  --   --   AST 13*   < > 40  ALT 10   < > 46*  ALKPHOS 43   < > 47  BILITOT 1.1   < > 0.3   < > = values in this interval not displayed.   ------------------------------------------------------------------------------------------------------------------  Cardiac Enzymes No results for input(s): TROPONINI in the last 168 hours. ------------------------------------------------------------------------------------------------------------------  RADIOLOGY:  Dg Chest Port 1 View  Result Date: 08/31/2018 CLINICAL DATA:  Follow-up pneumonia  EXAM: PORTABLE CHEST 1 VIEW COMPARISON:  08/28/2018 chest radiograph and prior studies FINDINGS: Decreased bilateral airspace opacities noted. Trace bilateral pleural effusions appear slightly decreased as well. No pneumothorax. Subsegmental atelectasis in the RIGHT LOWER lung noted. IMPRESSION: Decreased bilateral airspace opacities/pneumonia.  Electronically Signed   By: Harmon PierJeffrey  Hu M.D.   On: 08/31/2018 13:02    EKG:   Orders placed or performed during the hospital encounter of 08/26/18  . ED EKG 12-Lead  . ED EKG 12-Lead    ASSESSMENT AND PLAN:   23 year old [place] gentleman presenting  with complaints of difficulty breathing, chest tightness, generalized malaise and fever.  1. Acute hypoxic respiratory failure- initial concerns for potential COVID 19 however he has tested negative.Multilobar pneumonia remains in the differentials -CT chest shows multifocal consolidative and ground glass opacities bilaterally consistent with pneumonitis with worsening findings on repeat chest x-ray - insidious infectious workup , including HIV and Influenza neg. -On azithromycin.  Vancomycin, cefepime and ceftriaxone discontinued -Completed hydroxychloroquine and Tocilizumab,  -Weaned off supplemental oxygen at this time -Per ID he will remain on airborne/contact precaution even if coronavirus test is negative(may be false negative)- repeat test also negative for COVID 19, ID suggesting to check for fungal inf and Cardiac origin. -ID consult appreciated  2.  Acute febrile illness with pneumonitis: Improving -WBC improved -Procalcitonin <0.10 -Continue antibiotics as above  3.  Sinus tachycardia-likely due to acute hypoxic respiratory failure -Improved and now back to NSR -Continue telemetry monitoring- check ECho.  4.  DVT prophylaxis-Lovenox  All the records are reviewed and case discussed with Care Management/Social Workerr. Management plans discussed with the patient, family and they are in agreement.  Due to language barrier, an interpreter was present during the history-taking and subsequent discussion (and for part of the physical exam) with this patient.  CODE STATUS: FULL  TOTAL TIME TAKING CARE OF THIS PATIENT: 34 minutes.   POSSIBLE D/C IN 1-2 DAYS, DEPENDING ON CLINICAL CONDITION.  Vaibahvkumar Lemond Griffee,MD    09/01/2018 at 9:02 AM  Between 7am to 6pm - Pager - [phone]  After 6pm go to [email or web address] - password [email or web address]  [facility]  Office  [phone]  CC: Primary care physician; Patient, No Pcp Per

## 2018-09-02 LAB — LEGIONELLA PNEUMOPHILA SEROGP 1 UR AG: L. pneumophila Serogp 1 Ur Ag: NEGATIVE

## 2018-09-02 LAB — HIV-1 RNA QUANT-NO REFLEX-BLD
HIV 1 RNA Quant: <20 copies/mL
LOG10 HIV-1 RNA: UNDETERMINED log10copy/mL

## 2018-09-02 LAB — FUNGITELL, SERUM: Fungitell Result: <31 pg/mL (ref <80)

## 2018-09-03 LAB — CRYPTOCOCCAL ANTIGEN: Crypto Ag: NEGATIVE

## 2018-09-04 LAB — FUNGAL ANTIBODIES PANEL, ID-BLOOD
Aspergillus flavus: NEGATIVE
Aspergillus fumigatus, IgG: NEGATIVE
Aspergillus niger: NEGATIVE
Blastomyces Abs, Qn, DID: NEGATIVE
Histoplasma Ab, Immunodiffusion: NEGATIVE

## 2020-12-26 IMAGING — DX PORTABLE CHEST - 1 VIEW
1 series · 1 of 1 positions shown · non-contrast
Comparison: 08/26/2018

CLINICAL DATA: Followup pneumonia

EXAM:
PORTABLE CHEST 1 VIEW

[chest ap]
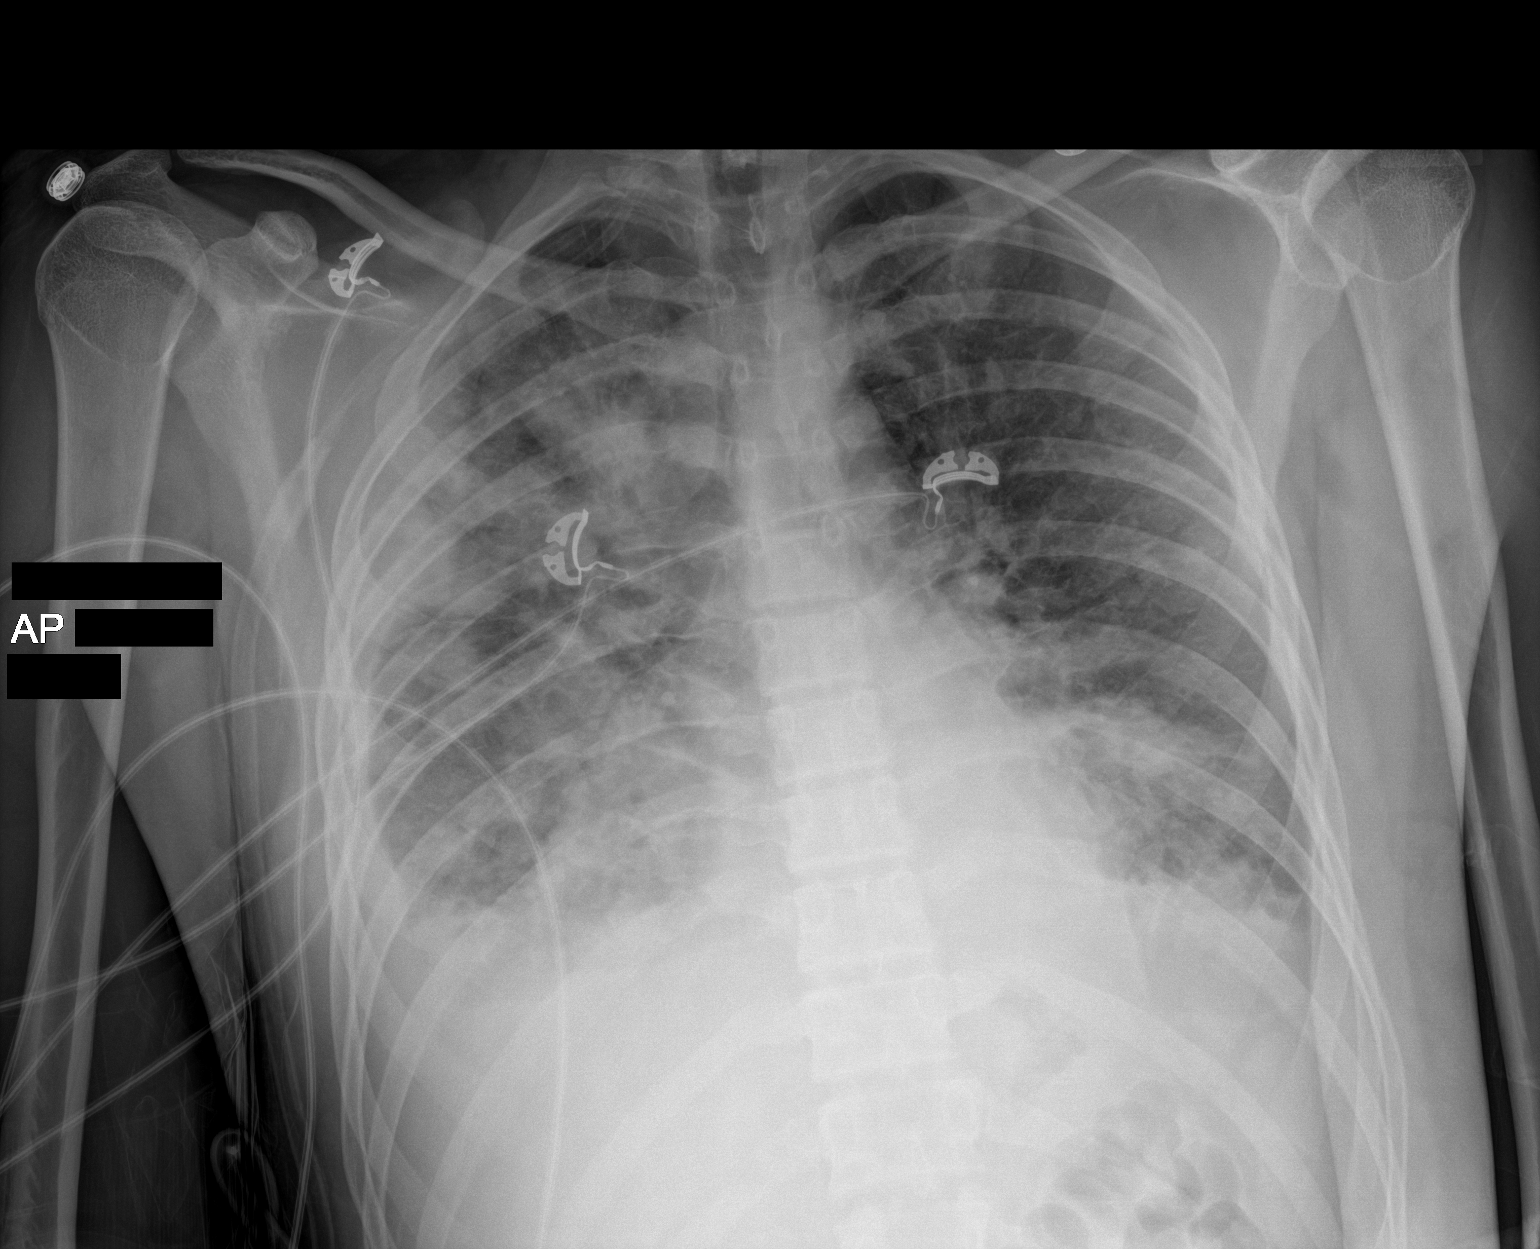

[1 of 1 positions shown; findings below may reference images not displayed]

FINDINGS: Widespread bilateral patchy pulmonary infiltrates persist, with
pleural effusions right larger than left. There is slight
radiographic worsening of these findings. No qualitatively new
finding.
IMPRESSION: Slight radiographic worsening of widespread bilateral patchy
pulmonary infiltrates and pleural effusions. Findings could be
secondary to bacterial or viral pneumonia.

## 2020-12-29 IMAGING — DX PORTABLE CHEST - 1 VIEW
1 series · 1 of 1 positions shown · non-contrast
Comparison: 08/28/2018 chest radiograph and prior studies

CLINICAL DATA: Follow-up pneumonia

EXAM:
PORTABLE CHEST 1 VIEW

[chest ap]
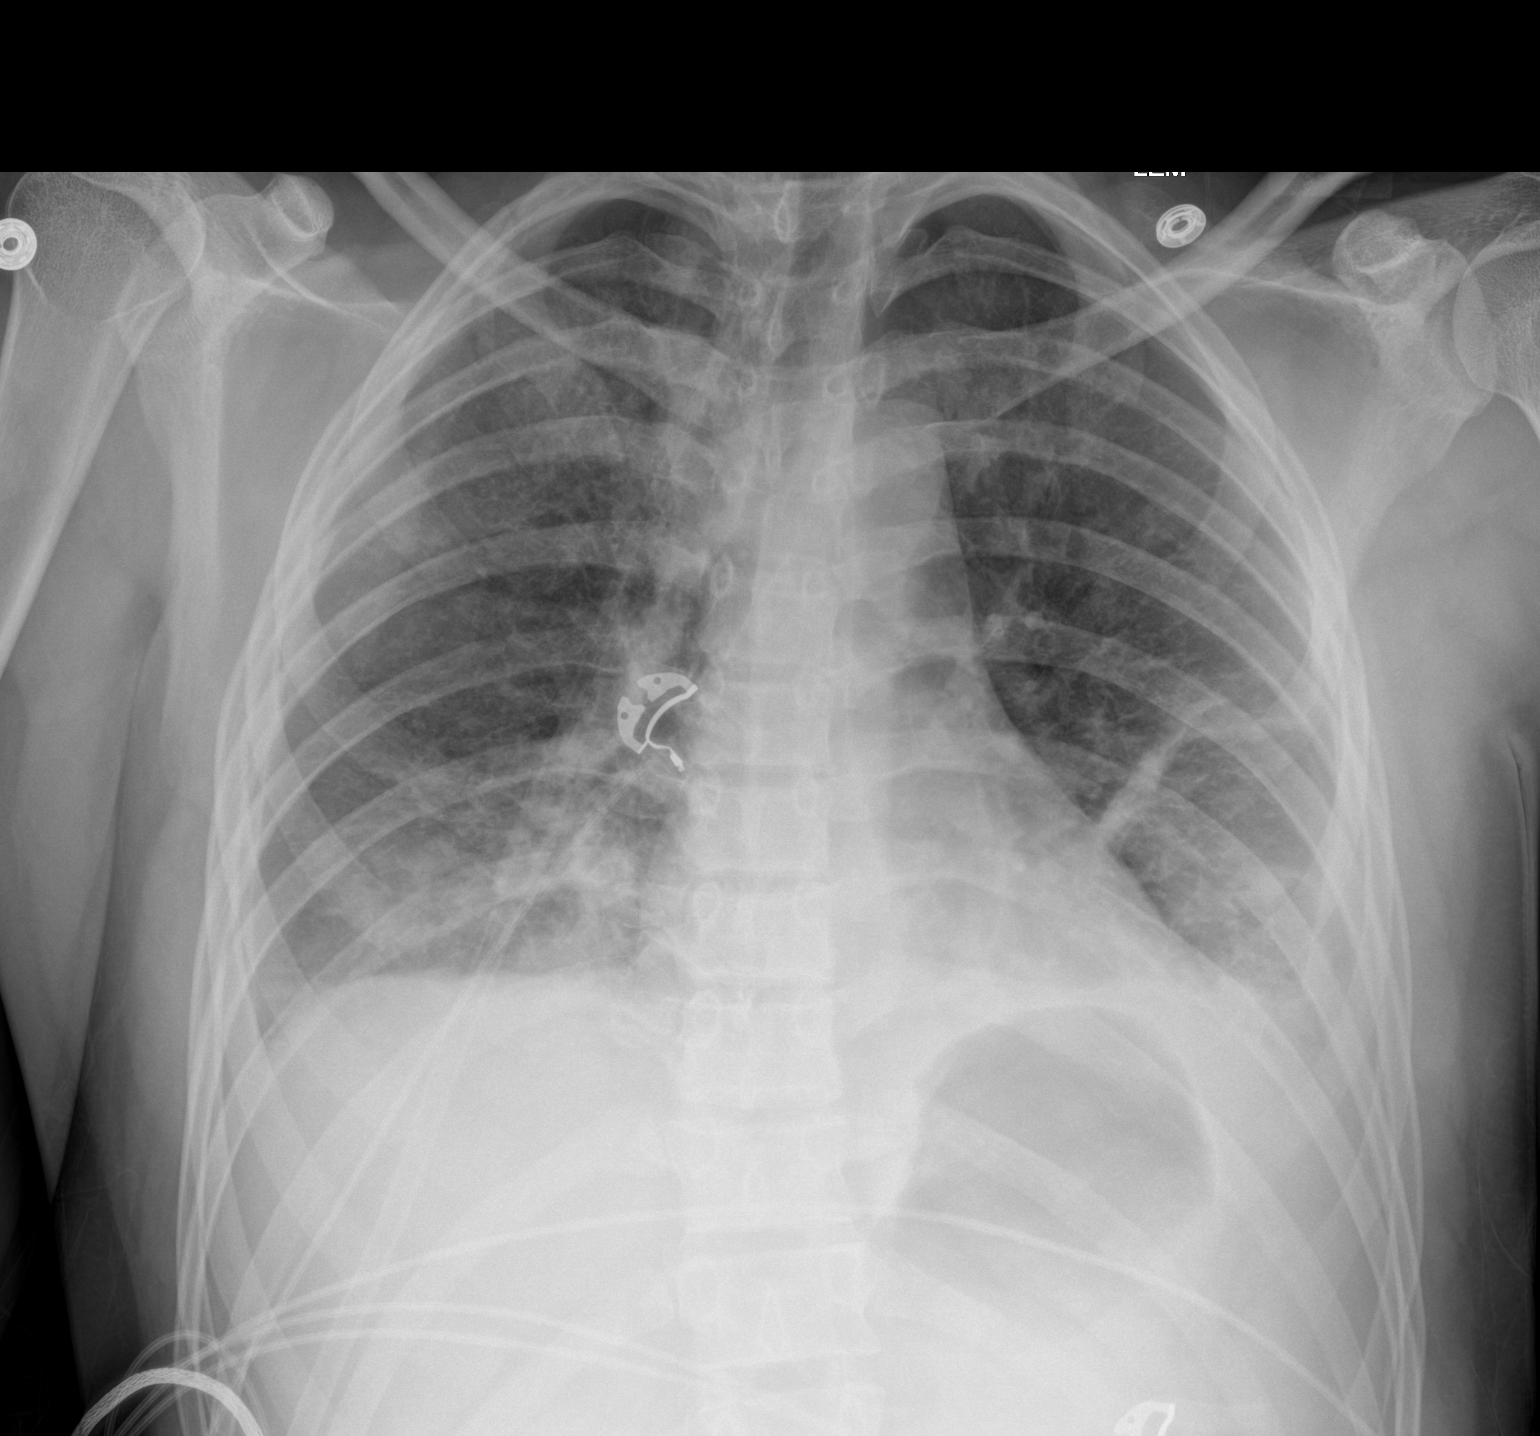

[1 of 1 positions shown; findings below may reference images not displayed]

FINDINGS: Decreased bilateral airspace opacities noted. Trace bilateral
pleural effusions appear slightly decreased as well.

No pneumothorax.

Subsegmental atelectasis in the RIGHT LOWER lung noted.
IMPRESSION: Decreased bilateral airspace opacities/pneumonia.
